# Patient Record
Sex: Female | Born: 1955
Health system: Southern US, Community
[De-identification: ages and names within clinical notes are randomized; demographics above are authoritative.]

## PROBLEM LIST (undated history)

## (undated) DIAGNOSIS — A64 Unspecified sexually transmitted disease: Secondary | ICD-10-CM

## (undated) DIAGNOSIS — N809 Endometriosis, unspecified: Secondary | ICD-10-CM

## (undated) DIAGNOSIS — IMO0002 Reserved for concepts with insufficient information to code with codable children: Secondary | ICD-10-CM

## (undated) DIAGNOSIS — B009 Herpesviral infection, unspecified: Secondary | ICD-10-CM

## (undated) DIAGNOSIS — Z87440 Personal history of urinary (tract) infections: Secondary | ICD-10-CM

## (undated) DIAGNOSIS — B977 Papillomavirus as the cause of diseases classified elsewhere: Secondary | ICD-10-CM

## (undated) DIAGNOSIS — D219 Benign neoplasm of connective and other soft tissue, unspecified: Secondary | ICD-10-CM

## (undated) DIAGNOSIS — I2510 Atherosclerotic heart disease of native coronary artery without angina pectoris: Secondary | ICD-10-CM

## (undated) DIAGNOSIS — D259 Leiomyoma of uterus, unspecified: Secondary | ICD-10-CM

## (undated) DIAGNOSIS — R87610 Atypical squamous cells of undetermined significance on cytologic smear of cervix (ASC-US): Secondary | ICD-10-CM

## (undated) DIAGNOSIS — N951 Menopausal and female climacteric states: Secondary | ICD-10-CM

## (undated) DIAGNOSIS — N924 Excessive bleeding in the premenopausal period: Secondary | ICD-10-CM

## (undated) HISTORY — DX: Reserved for concepts with insufficient information to code with codable children: IMO0002

## (undated) HISTORY — DX: Atherosclerotic heart disease of native coronary artery without angina pectoris: I25.10

## (undated) HISTORY — DX: Endometriosis, unspecified: N80.9

## (undated) HISTORY — DX: Benign neoplasm of connective and other soft tissue, unspecified: D21.9

## (undated) HISTORY — DX: Personal history of urinary (tract) infections: Z87.440

## (undated) HISTORY — PX: WISDOM TOOTH EXTRACTION: SHX21

## (undated) HISTORY — DX: Herpesviral infection, unspecified: B00.9

## (undated) HISTORY — DX: Excessive bleeding in the premenopausal period: N92.4

## (undated) HISTORY — DX: Leiomyoma of uterus, unspecified: D25.9

## (undated) HISTORY — DX: Atypical squamous cells of undetermined significance on cytologic smear of cervix (ASC-US): R87.610

## (undated) HISTORY — DX: Menopausal and female climacteric states: N95.1

## (undated) HISTORY — DX: Unspecified sexually transmitted disease: A64

## (undated) HISTORY — DX: Papillomavirus as the cause of diseases classified elsewhere: B97.7

---
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
## 1995-04-22 HISTORY — PX: MYOMECTOMY: SHX85

## 1995-04-22 HISTORY — PX: LAPAROSCOPY: SHX197

## 1997-05-22 ENCOUNTER — Encounter: Admission: RE | Admit: 1997-05-22 | Discharge: 1997-08-20 | Payer: Self-pay | Admitting: Obstetrics and Gynecology

## 1998-05-03 ENCOUNTER — Ambulatory Visit (HOSPITAL_COMMUNITY): Admission: RE | Admit: 1998-05-03 | Discharge: 1998-05-03 | Payer: Self-pay | Admitting: Obstetrics and Gynecology

## 2000-02-14 ENCOUNTER — Encounter: Payer: Self-pay | Admitting: Obstetrics and Gynecology

## 2000-02-14 ENCOUNTER — Ambulatory Visit (HOSPITAL_COMMUNITY): Admission: RE | Admit: 2000-02-14 | Discharge: 2000-02-14 | Payer: Self-pay | Admitting: Obstetrics and Gynecology

## 2000-02-19 ENCOUNTER — Encounter: Payer: Self-pay | Admitting: Obstetrics and Gynecology

## 2000-02-19 ENCOUNTER — Encounter: Admission: RE | Admit: 2000-02-19 | Discharge: 2000-02-19 | Payer: Self-pay | Admitting: Obstetrics and Gynecology

## 2001-02-04 ENCOUNTER — Other Ambulatory Visit: Admission: RE | Admit: 2001-02-04 | Discharge: 2001-02-04 | Payer: Self-pay | Admitting: Obstetrics and Gynecology

## 2001-02-19 ENCOUNTER — Encounter: Payer: Self-pay | Admitting: Obstetrics and Gynecology

## 2001-02-19 ENCOUNTER — Ambulatory Visit (HOSPITAL_COMMUNITY): Admission: RE | Admit: 2001-02-19 | Discharge: 2001-02-19 | Payer: Self-pay | Admitting: Obstetrics and Gynecology

## 2002-03-29 ENCOUNTER — Other Ambulatory Visit: Admission: RE | Admit: 2002-03-29 | Discharge: 2002-03-29 | Payer: Self-pay | Admitting: Obstetrics and Gynecology

## 2002-04-05 ENCOUNTER — Ambulatory Visit (HOSPITAL_COMMUNITY): Admission: RE | Admit: 2002-04-05 | Discharge: 2002-04-05 | Payer: Self-pay | Admitting: Obstetrics and Gynecology

## 2002-04-05 ENCOUNTER — Encounter: Payer: Self-pay | Admitting: Obstetrics and Gynecology

## 2003-04-03 ENCOUNTER — Other Ambulatory Visit: Admission: RE | Admit: 2003-04-03 | Discharge: 2003-04-03 | Payer: Self-pay | Admitting: Obstetrics and Gynecology

## 2003-05-01 ENCOUNTER — Ambulatory Visit (HOSPITAL_COMMUNITY): Admission: RE | Admit: 2003-05-01 | Discharge: 2003-05-01 | Payer: Self-pay | Admitting: Obstetrics and Gynecology

## 2004-03-20 ENCOUNTER — Other Ambulatory Visit: Admission: RE | Admit: 2004-03-20 | Discharge: 2004-03-20 | Payer: Self-pay | Admitting: Family Medicine

## 2005-06-11 ENCOUNTER — Other Ambulatory Visit: Admission: RE | Admit: 2005-06-11 | Discharge: 2005-06-11 | Payer: Self-pay | Admitting: Family Medicine

## 2005-11-05 ENCOUNTER — Ambulatory Visit (HOSPITAL_COMMUNITY): Admission: RE | Admit: 2005-11-05 | Discharge: 2005-11-05 | Payer: Self-pay | Admitting: Family Medicine

## 2006-11-09 ENCOUNTER — Ambulatory Visit (HOSPITAL_COMMUNITY): Admission: RE | Admit: 2006-11-09 | Discharge: 2006-11-09 | Payer: Self-pay | Admitting: Obstetrics and Gynecology

## 2007-10-27 DIAGNOSIS — B009 Herpesviral infection, unspecified: Secondary | ICD-10-CM

## 2007-10-27 HISTORY — DX: Herpesviral infection, unspecified: B00.9

## 2007-11-24 ENCOUNTER — Ambulatory Visit (HOSPITAL_COMMUNITY): Admission: RE | Admit: 2007-11-24 | Discharge: 2007-11-24 | Payer: Self-pay | Admitting: Obstetrics and Gynecology

## 2008-11-30 ENCOUNTER — Ambulatory Visit (HOSPITAL_COMMUNITY): Admission: RE | Admit: 2008-11-30 | Discharge: 2008-11-30 | Payer: Self-pay | Admitting: Obstetrics and Gynecology

## 2009-12-06 ENCOUNTER — Ambulatory Visit (HOSPITAL_COMMUNITY): Admission: RE | Admit: 2009-12-06 | Discharge: 2009-12-06 | Payer: Self-pay | Admitting: Obstetrics and Gynecology

## 2010-05-12 ENCOUNTER — Encounter: Payer: Self-pay | Admitting: Family Medicine

## 2011-01-10 ENCOUNTER — Other Ambulatory Visit (HOSPITAL_COMMUNITY): Payer: Self-pay | Admitting: Obstetrics and Gynecology

## 2011-01-10 DIAGNOSIS — Z1231 Encounter for screening mammogram for malignant neoplasm of breast: Secondary | ICD-10-CM

## 2011-01-22 ENCOUNTER — Ambulatory Visit (HOSPITAL_COMMUNITY)
Admission: RE | Admit: 2011-01-22 | Discharge: 2011-01-22 | Disposition: A | Discharge status: Home / Self Care | Payer: 59 | Source: Ambulatory Visit | Attending: Obstetrics and Gynecology | Admitting: Obstetrics and Gynecology

## 2011-01-22 DIAGNOSIS — Z1231 Encounter for screening mammogram for malignant neoplasm of breast: Secondary | ICD-10-CM | POA: Insufficient documentation

## 2011-06-13 ENCOUNTER — Ambulatory Visit
Admission: RE | Admit: 2011-06-13 | Discharge: 2011-06-13 | Disposition: A | Discharge status: Home / Self Care | Payer: 59 | Source: Ambulatory Visit | Attending: Family Medicine | Admitting: Family Medicine

## 2011-06-13 ENCOUNTER — Other Ambulatory Visit: Payer: Self-pay | Admitting: Family Medicine

## 2011-06-13 DIAGNOSIS — R109 Unspecified abdominal pain: Secondary | ICD-10-CM

## 2011-07-01 ENCOUNTER — Ambulatory Visit (INDEPENDENT_AMBULATORY_CARE_PROVIDER_SITE_OTHER): Payer: 59 | Admitting: General Surgery

## 2011-07-14 ENCOUNTER — Encounter (INDEPENDENT_AMBULATORY_CARE_PROVIDER_SITE_OTHER): Payer: Self-pay | Admitting: General Surgery

## 2011-07-14 ENCOUNTER — Ambulatory Visit (INDEPENDENT_AMBULATORY_CARE_PROVIDER_SITE_OTHER): Payer: 59 | Admitting: General Surgery

## 2011-07-14 VITALS — BP 114/76 | HR 76 | Temp 98.0°F | Resp 16 | Ht 66.0 in | Wt 132.8 lb

## 2011-07-14 DIAGNOSIS — K8020 Calculus of gallbladder without cholecystitis without obstruction: Secondary | ICD-10-CM

## 2011-07-14 NOTE — Patient Instructions (Signed)
[facility] LAPAROSCOPIC SURGERY: POST OP INSTRUCTIONS  Always review your discharge instruction sheet given to you by the facility where your surgery was performed. IF YOU HAVE DISABILITY OR FAMILY LEAVE FORMS, YOU MUST BRING THEM TO THE OFFICE FOR PROCESSING.   DO NOT GIVE THEM TO YOUR DOCTOR.  1. A prescription for pain medication may be given to you upon discharge.  Take your pain medication as prescribed, if needed.  If narcotic pain medicine is not needed, then you may take acetaminophen (Tylenol), naprosyn (Alleve), or ibuprofen (Advil) as needed. 2. Take your usually prescribed medications unless otherwise directed. 3. If you need a refill on your pain medication, please contact your pharmacy.  They will contact our office to request authorization. Prescriptions will not be filled after 5pm or on week-ends. 4. You should follow a light diet the first few days after arrival home, such as soup and crackers, etc.  Be sure to include lots of fluids daily. 5. Most patients will experience some swelling and bruising in the area of the incisions.  Ice packs will help.  Swelling and bruising can take several days to resolve.  6. It is common to experience some constipation if taking pain medication after surgery.  Increasing fluid intake and taking a stool softener (such as Colace) will usually help or prevent this problem from occurring.  A mild laxative (Milk of Magnesia or Miralax) should be taken according to package instructions if there are no bowel movements after 48 hours. 7. Unless discharge instructions indicate otherwise, you may remove your bandages 48 hours after surgery, and you may shower at that time.  You may have steri-strips (small skin tapes) in place directly over the incision.  These strips should be left on the skin for 7-10 days.  If your surgeon used skin glue on the incision, you may shower in 24 hours.  The glue will flake  off over the next 2-3 weeks.  Any sutures or staples will be removed at the office during your follow-up visit. 8. ACTIVITIES:  You may resume regular (light) daily activities beginning the next day--such as daily self-care, walking, climbing stairs--gradually increasing activities as tolerated.  You may have sexual intercourse when it is comfortable.  Refrain from any heavy lifting or straining until approved by your doctor. a. You may drive when you are no longer taking prescription pain medication, you can comfortably wear a seatbelt, and you can safely maneuver your car and apply brakes. b. RETURN TO WORK:  __________________________________________________________ 9. You should see your doctor in the office for a follow-up appointment approximately 2-3 weeks after your surgery.  Make sure that you call for this appointment within a day or two after you arrive home to insure a convenient appointment time. 10. OTHER INSTRUCTIONS: __________________________________________________________________________________________________________________________ __________________________________________________________________________________________________________________________ WHEN TO CALL YOUR DOCTOR: 1. Fever over 101.0 2. Inability to urinate 3. Continued bleeding from incision. 4. Increased pain, redness, or drainage from the incision. 5. Increasing abdominal pain  The clinic staff is available to answer your questions during regular business hours.  Please don't hesitate to call and ask to speak to one of the nurses for clinical concerns.  If you have a medical emergency, go to the nearest emergency room or call 911.  A surgeon from [facility] is always on call at the hospital. [address], [address], [place], [place]  [address] ? [address] [address], [place], [place]   [address] [phone] ? [phone] ? [phone] [phone]   [phone] Web site: [email or web address]  

## 2011-07-14 NOTE — Progress Notes (Signed)
Patient ID: Meghan Peterson, female   DOB: [birth year], 56 y.o.   MRN: [id number]  Chief Complaint  Patient presents with  . Abdominal Pain    new pt- eval GB    HPI Meghan Peterson is a 56 y.o. female.  Referred by Dr. Elias Else HPI This is a 56 year old female who is otherwise healthy who presents after having about 4 episodes this year of epigastric and right upper quadrant discomfort associated with nausea and emesis. She noted that when she has been eating at night she will lie down and then began to have some significant nausea as well as multiple episodes of emesis. This is associated with some epigastric and right upper quadrant discomfort also. This has happened 4 times since January. Her last episode was in February. She has changed her diet since then and is feels much better. She underwent a ultrasound which showed a fairly large gallstone and then was referred for possible surgery. She has no fevers associated with this. Eating certain foods does make it worse. She has gained some relief by changing her diet.  History reviewed. No pertinent past medical history.  Past Surgical History  Procedure Date  . Cesarean section 12/17/96  . Laparoscopy 1997  . Breast cyst excision 1978    left breast    Family History  Problem Relation Age of Onset  . Kidney disease Father   . Cancer Father     colon  . Cancer Maternal Aunt     breast    Social History History  Substance Use Topics  . Smoking status: Former Smoker    Quit date: 04/22/1987  . Smokeless tobacco: Not on file  . Alcohol Use: Yes    No Known Allergies  Current Outpatient Prescriptions  Medication Sig Dispense Refill  . etonogestrel-ethinyl estradiol (NUVARING) 0.12-0.015 MG/24HR vaginal ring Place 1 each vaginally every 28 (twenty-eight) days. Insert vaginally and leave in place for 3 consecutive weeks, then remove for 1 week.        Review of Systems Review of Systems  Constitutional: Negative for  fever, chills and unexpected weight change.  HENT: Negative for hearing loss, congestion, sore throat, trouble swallowing and voice change.   Eyes: Negative for visual disturbance.  Respiratory: Negative for cough and wheezing.   Cardiovascular: Negative for chest pain, palpitations and leg swelling.  Gastrointestinal: Positive for nausea and abdominal distention. Negative for vomiting, abdominal pain, diarrhea, constipation, blood in stool and anal bleeding.  Genitourinary: Negative for hematuria, vaginal bleeding and difficulty urinating.  Musculoskeletal: Negative for arthralgias.  Skin: Negative for rash and wound.  Neurological: Negative for seizures, syncope and headaches.  Hematological: Negative for adenopathy. Does not bruise/bleed easily.  Psychiatric/Behavioral: Negative for confusion.    Blood pressure 114/76, pulse 76, temperature 98 F (36.7 C), temperature source Temporal, resp. rate 16, height 5\' 6"  (1.676 m), weight 132 lb 12.8 oz (60.238 kg).  Physical Exam Physical Exam  Vitals reviewed. Constitutional: She appears well-developed and well-nourished.  Eyes: No scleral icterus.  Neck: Neck supple.  Cardiovascular: Normal rate, regular rhythm and normal heart sounds.   Pulmonary/Chest: Effort normal and breath sounds normal. She has no wheezes. She has no rales.  Abdominal: Soft. Bowel sounds are normal. There is no hepatomegaly. There is no tenderness.  Lymphadenopathy:    She has no cervical adenopathy.    Data Reviewed COMPLETE ABDOMINAL ULTRASOUND  Comparison: None.  Findings:  Gallbladder: 3.9 cm gallstone. Additional gallbladder  sludge/small stones. No pericholecystic  fluid, gallbladder wall  thickening, or sonographic Murphy's sign.  Common bile duct: Measures 5 mm.  Liver: No focal lesion identified. Within normal limits in  parenchymal echogenicity.  IVC: Appears normal.  Pancreas: Incompletely visualized but grossly unremarkable.  Spleen: Poorly  visualized. Measures approximately 4.6 cm.  Right Kidney: Measures 10.8 cm. No mass or hydronephrosis.  Left Kidney: Measures 10.0 cm. No mass or hydronephrosis  Abdominal aorta: No aneurysm identified.  IMPRESSION:  Cholelithiasis, without associated inflammatory changes to suggest  acute cholecystitis.   Assessment    Symptomatic cholelithiasis    Plan   I do think her symptoms are referable to her gallbladder I discussed the procedure in detail.  The patient was given Agricultural engineer.  We discussed the risks and benefits of a laparoscopic cholecystectomy and possible cholangiogram including, but not limited to bleeding, infection, injury to surrounding structures such as the intestine or liver, bile leak, retained gallstones, need to convert to an open procedure, prolonged diarrhea, blood clots such as  DVT, common bile duct injury, anesthesia risks, and possible need for additional procedures.  The likelihood of improvement in symptoms and return to the patient's normal status is good. We discussed the typical post-operative recovery course.        Onofrio Klemp 07/14/2011, 12:16 PM

## 2011-07-31 ENCOUNTER — Telehealth (INDEPENDENT_AMBULATORY_CARE_PROVIDER_SITE_OTHER): Payer: Self-pay | Admitting: General Surgery

## 2011-07-31 ENCOUNTER — Encounter (INDEPENDENT_AMBULATORY_CARE_PROVIDER_SITE_OTHER): Payer: Self-pay

## 2011-07-31 NOTE — Telephone Encounter (Signed)
LMOM for pt stating that I mailed her RTW note to her home.

## 2011-08-01 ENCOUNTER — Telehealth (INDEPENDENT_AMBULATORY_CARE_PROVIDER_SITE_OTHER): Payer: Self-pay

## 2011-08-01 ENCOUNTER — Encounter (INDEPENDENT_AMBULATORY_CARE_PROVIDER_SITE_OTHER): Payer: Self-pay

## 2011-08-01 NOTE — Telephone Encounter (Signed)
LMOM stating that Dr Dwain Sarna did agree that the pt will be out of work for 4wks. I did get a new letter written for pt's work and mailed to pt's home.

## 2011-08-07 ENCOUNTER — Other Ambulatory Visit (INDEPENDENT_AMBULATORY_CARE_PROVIDER_SITE_OTHER): Payer: Self-pay | Admitting: General Surgery

## 2011-08-07 ENCOUNTER — Telehealth (INDEPENDENT_AMBULATORY_CARE_PROVIDER_SITE_OTHER): Payer: Self-pay

## 2011-08-07 NOTE — Telephone Encounter (Signed)
[facility] pre admit needs surgery orders put in on Meghan Peterson [birth year]. Surgery is Tuesday.

## 2011-08-11 ENCOUNTER — Ambulatory Visit: Payer: Self-pay | Admitting: Obstetrics and Gynecology

## 2011-08-11 ENCOUNTER — Encounter (HOSPITAL_COMMUNITY): Payer: Self-pay | Admitting: Pharmacy Technician

## 2011-08-12 ENCOUNTER — Encounter (HOSPITAL_COMMUNITY): Payer: Self-pay

## 2011-08-12 ENCOUNTER — Encounter (HOSPITAL_COMMUNITY)
Admission: RE | Admit: 2011-08-12 | Discharge: 2011-08-12 | Disposition: A | Discharge status: Home / Self Care | Payer: 59 | Source: Ambulatory Visit | Attending: General Surgery | Admitting: General Surgery

## 2011-08-12 LAB — COMPREHENSIVE METABOLIC PANEL
ALT: 10 U/L (ref 0–35)
AST: 12 U/L (ref 0–37)
Albumin: 3.5 g/dL (ref 3.5–5.2)
CO2: 28 mEq/L (ref 19–32)
Calcium: 9.8 mg/dL (ref 8.4–10.5)
Chloride: 106 mEq/L (ref 96–112)
GFR calc non Af Amer: 82 mL/min — ABNORMAL LOW (ref >90)
Sodium: 141 mEq/L (ref 135–145)
Total Bilirubin: 0.3 mg/dL (ref 0.3–1.2)

## 2011-08-12 LAB — SURGICAL PCR SCREEN: MRSA, PCR: NEGATIVE

## 2011-08-12 LAB — CBC
Platelets: 370 10*3/uL (ref 150–400)
RBC: 4.64 MIL/uL (ref 3.87–5.11)
RDW: 14 % (ref 11.5–15.5)
WBC: 5.2 10*3/uL (ref 4.0–10.5)

## 2011-08-12 NOTE — Pre-Procedure Instructions (Signed)
CBC, CMET, SERUM PREGNANCY AND EKG WERE DONE TODAY AT [facility] PREOP.

## 2011-08-12 NOTE — Patient Instructions (Signed)
YOUR SURGERY IS SCHEDULED ON:  WED MAY 1 -- AT 8:30 AM  REPORT TO [facility] AT: 6:30 AM      PHONE # FOR SHORT STAY IS [phone]  DO NOT EAT OR DRINK ANYTHING AFTER MIDNIGHT THE NIGHT BEFORE YOUR SURGERY.  YOU MAY BRUSH YOUR TEETH, RINSE OUT YOUR MOUTH--BUT NO WATER, NO FOOD, NO CHEWING GUM, NO MINTS, NO CANDIES, NO CHEWING TOBACCO.  PLEASE TAKE THE FOLLOWING MEDICATIONS THE AM OF YOUR SURGERY WITH A FEW SIPS OF WATER:  NO MEDS TO TAKE    IF YOU USE INHALERS--USE YOUR INHALERS THE AM OF YOUR SURGERY AND BRING INHALERS TO THE HOSPITAL -TAKE TO SURGERY.    IF YOU ARE DIABETIC:  DO NOT TAKE ANY DIABETIC MEDICATIONS THE AM OF YOUR SURGERY.  IF YOU TAKE INSULIN IN THE EVENINGS--PLEASE ONLY TAKE 1/2 NORMAL EVENING DOSE THE NIGHT BEFORE YOUR SURGERY.  NO INSULIN THE AM OF YOUR SURGERY.  IF YOU HAVE SLEEP APNEA AND USE CPAP OR BIPAP--PLEASE BRING THE MASK --NOT THE MACHINE-NOT THE TUBING   -JUST THE MASK. DO NOT BRING VALUABLES, MONEY, CREDIT CARDS.  CONTACT LENS, DENTURES / PARTIALS, GLASSES SHOULD NOT BE WORN TO SURGERY AND IN MOST CASES-HEARING AIDS WILL NEED TO BE REMOVED.  BRING YOUR GLASSES CASE, ANY EQUIPMENT NEEDED FOR YOUR CONTACT LENS. FOR PATIENTS ADMITTED TO THE HOSPITAL--CHECK OUT TIME THE DAY OF DISCHARGE IS 11:00 AM.  ALL INPATIENT ROOMS ARE PRIVATE - WITH BATHROOM, TELEPHONE, TELEVISION AND WIFI INTERNET. IF YOU ARE BEING DISCHARGED THE SAME DAY OF YOUR SURGERY--YOU CAN NOT DRIVE YOURSELF HOME--AND SHOULD NOT GO HOME ALONE BY TAXI OR BUS.  NO DRIVING OR OPERATING MACHINERY FOR 24 HOURS FOLLOWING ANESTHESIA / PAIN MEDICATIONS.                            SPECIAL INSTRUCTIONS:  CHLORHEXIDINE SOAP SHOWER (other brand names are Betasept and Hibiclens ) PLEASE SHOWER WITH CHLORHEXIDINE THE NIGHT BEFORE YOUR SURGERY AND THE AM OF YOUR SURGERY. DO NOT USE CHLORHEXIDINE ON YOUR FACE OR PRIVATE AREAS--YOU MAY USE YOUR NORMAL SOAP THOSE AREAS AND YOUR NORMAL SHAMPOO.    WOMEN SHOULD AVOID SHAVING UNDER ARMS AND SHAVING LEGS 48 HOURS BEFORE USING CHLORHEXIDINE TO AVOID SKIN IRRITATION.  DO NOT USE IF ALLERGIC TO CHLORHEXIDINE.  PLEASE READ OVER ANY  FACT SHEETS THAT YOU WERE GIVEN: MRSA INFORMATION

## 2011-08-18 ENCOUNTER — Other Ambulatory Visit: Payer: Self-pay | Admitting: Obstetrics and Gynecology

## 2011-08-19 ENCOUNTER — Other Ambulatory Visit: Payer: Self-pay

## 2011-08-19 MED ORDER — ETONOGESTREL-ETHINYL ESTRADIOL 0.12-0.015 MG/24HR VA RING: VAGINAL_RING | VAGINAL | Status: DC

## 2011-08-19 NOTE — Telephone Encounter (Signed)
Rx sent to pharmacy for Nuvaring per rx request. Pt has AEX sched 09-17-11@10 :45a with [facility]. Pt aware.

## 2011-08-20 ENCOUNTER — Encounter (HOSPITAL_COMMUNITY): Payer: Self-pay | Admitting: *Deleted

## 2011-08-20 ENCOUNTER — Encounter (HOSPITAL_COMMUNITY)
Admission: RE | Disposition: A | Discharge status: Home / Self Care | Payer: Self-pay | Source: Ambulatory Visit | Attending: General Surgery

## 2011-08-20 ENCOUNTER — Ambulatory Visit (HOSPITAL_COMMUNITY)
Admission: RE | Admit: 2011-08-20 | Discharge: 2011-08-20 | Disposition: A | Discharge status: Home / Self Care | Payer: 59 | Source: Ambulatory Visit | Attending: General Surgery | Admitting: General Surgery

## 2011-08-20 ENCOUNTER — Encounter (HOSPITAL_COMMUNITY): Payer: Self-pay | Admitting: Anesthesiology

## 2011-08-20 ENCOUNTER — Ambulatory Visit (HOSPITAL_COMMUNITY): Payer: 59

## 2011-08-20 ENCOUNTER — Ambulatory Visit (HOSPITAL_COMMUNITY): Payer: 59 | Admitting: Anesthesiology

## 2011-08-20 DIAGNOSIS — Z01812 Encounter for preprocedural laboratory examination: Secondary | ICD-10-CM | POA: Insufficient documentation

## 2011-08-20 DIAGNOSIS — K8010 Calculus of gallbladder with chronic cholecystitis without obstruction: Secondary | ICD-10-CM | POA: Insufficient documentation

## 2011-08-20 DIAGNOSIS — Z01810 Encounter for preprocedural cardiovascular examination: Secondary | ICD-10-CM | POA: Insufficient documentation

## 2011-08-20 HISTORY — PX: CHOLECYSTECTOMY: SHX55

## 2011-08-20 SURGERY — LAPAROSCOPIC CHOLECYSTECTOMY WITH INTRAOPERATIVE CHOLANGIOGRAM
Anesthesia: General | Site: Abdomen | Wound class: Contaminated

## 2011-08-20 MED ORDER — FENTANYL CITRATE 0.05 MG/ML IJ SOLN
INTRAMUSCULAR | Status: DC | PRN
  Administered 2011-08-20 (×5): 50 ug via INTRAVENOUS

## 2011-08-20 MED ORDER — NEOSTIGMINE METHYLSULFATE 1 MG/ML IJ SOLN
INTRAMUSCULAR | Status: DC | PRN
  Administered 2011-08-20: 3 mg via INTRAVENOUS

## 2011-08-20 MED ORDER — LACTATED RINGERS IV SOLN
INTRAVENOUS | Status: DC | PRN
  Administered 2011-08-20 (×2): via INTRAVENOUS

## 2011-08-20 MED ORDER — IOHEXOL 300 MG/ML  SOLN
INTRAMUSCULAR | Status: AC
  Filled 2011-08-20: qty 1

## 2011-08-20 MED ORDER — MIDAZOLAM HCL 5 MG/5ML IJ SOLN
INTRAMUSCULAR | Status: DC | PRN
  Administered 2011-08-20: 2 mg via INTRAVENOUS

## 2011-08-20 MED ORDER — ONDANSETRON HCL 4 MG/2ML IJ SOLN
INTRAMUSCULAR | Status: DC | PRN
  Administered 2011-08-20: 4 mg via INTRAVENOUS

## 2011-08-20 MED ORDER — GLYCOPYRROLATE 0.2 MG/ML IJ SOLN
INTRAMUSCULAR | Status: DC | PRN
  Administered 2011-08-20: 0.4 mg via INTRAVENOUS

## 2011-08-20 MED ORDER — BUPIVACAINE HCL (PF) 0.25 % IJ SOLN
INTRAMUSCULAR | Status: DC | PRN
  Administered 2011-08-20: 16 mL

## 2011-08-20 MED ORDER — SODIUM CHLORIDE 0.9 % IJ SOLN: 3.0000 mL | INTRAMUSCULAR | Status: DC | PRN

## 2011-08-20 MED ORDER — IOHEXOL 300 MG/ML  SOLN
INTRAMUSCULAR | Status: DC | PRN
  Administered 2011-08-20: 4 mL via INTRAVENOUS

## 2011-08-20 MED ORDER — PROPOFOL 10 MG/ML IV BOLUS
INTRAVENOUS | Status: DC | PRN
  Administered 2011-08-20: 150 mg via INTRAVENOUS

## 2011-08-20 MED ORDER — OXYCODONE HCL 5 MG PO TABS
ORAL_TABLET | ORAL | Status: AC
  Filled 2011-08-20: qty 1

## 2011-08-20 MED ORDER — ONDANSETRON HCL 4 MG/2ML IJ SOLN: 4.0000 mg | Freq: Four times a day (QID) | INTRAMUSCULAR | Status: DC | PRN

## 2011-08-20 MED ORDER — ACETAMINOPHEN 10 MG/ML IV SOLN
INTRAVENOUS | Status: AC
  Filled 2011-08-20: qty 100

## 2011-08-20 MED ORDER — SODIUM CHLORIDE 0.9 % IJ SOLN: 3.0000 mL | Freq: Two times a day (BID) | INTRAMUSCULAR | Status: DC

## 2011-08-20 MED ORDER — DEXAMETHASONE SODIUM PHOSPHATE 10 MG/ML IJ SOLN
INTRAMUSCULAR | Status: DC | PRN
  Administered 2011-08-20: 10 mg via INTRAVENOUS

## 2011-08-20 MED ORDER — CEFOXITIN SODIUM-DEXTROSE 1-4 GM-% IV SOLR (PREMIX)
INTRAVENOUS | Status: AC
  Filled 2011-08-20: qty 50

## 2011-08-20 MED ORDER — ACETAMINOPHEN 650 MG RE SUPP
650.0000 mg | RECTAL | Status: DC | PRN
  Filled 2011-08-20: qty 1

## 2011-08-20 MED ORDER — OXYCODONE HCL 5 MG PO TABS
5.0000 mg | ORAL_TABLET | ORAL | Status: DC | PRN
Start: 2011-08-20 — End: 2011-08-20
  Administered 2011-08-20: 5 mg via ORAL

## 2011-08-20 MED ORDER — FENTANYL CITRATE 0.05 MG/ML IJ SOLN: 25.0000 ug | INTRAMUSCULAR | Status: DC | PRN

## 2011-08-20 MED ORDER — OXYCODONE-ACETAMINOPHEN 5-325 MG PO TABS: 1.0000 | ORAL_TABLET | ORAL | Status: AC | PRN

## 2011-08-20 MED ORDER — DEXTROSE 5 % IV SOLN
1.0000 g | INTRAVENOUS | Status: AC
  Administered 2011-08-20: 1 g via INTRAVENOUS

## 2011-08-20 MED ORDER — PROMETHAZINE HCL 25 MG/ML IJ SOLN: 6.2500 mg | INTRAMUSCULAR | Status: DC | PRN

## 2011-08-20 MED ORDER — MORPHINE SULFATE 10 MG/ML IJ SOLN: 2.0000 mg | INTRAMUSCULAR | Status: DC | PRN

## 2011-08-20 MED ORDER — 0.9 % SODIUM CHLORIDE (POUR BTL) OPTIME
TOPICAL | Status: DC | PRN
  Administered 2011-08-20: 1000 mL

## 2011-08-20 MED ORDER — LACTATED RINGERS IV SOLN: INTRAVENOUS | Status: DC

## 2011-08-20 MED ORDER — FENTANYL CITRATE 0.05 MG/ML IJ SOLN
INTRAMUSCULAR | Status: AC
  Filled 2011-08-20: qty 2

## 2011-08-20 MED ORDER — LACTATED RINGERS IR SOLN
Status: DC | PRN
  Administered 2011-08-20: 1000 mL

## 2011-08-20 MED ORDER — SODIUM CHLORIDE 0.9 % IV SOLN: 250.0000 mL | INTRAVENOUS | Status: DC | PRN

## 2011-08-20 MED ORDER — BUPIVACAINE HCL (PF) 0.25 % IJ SOLN
INTRAMUSCULAR | Status: AC
  Filled 2011-08-20: qty 30

## 2011-08-20 MED ORDER — SUCCINYLCHOLINE CHLORIDE 20 MG/ML IJ SOLN
INTRAMUSCULAR | Status: DC | PRN
  Administered 2011-08-20: 100 mg via INTRAVENOUS

## 2011-08-20 MED ORDER — ACETAMINOPHEN 325 MG PO TABS: 650.0000 mg | ORAL_TABLET | ORAL | Status: DC | PRN

## 2011-08-20 MED ORDER — LIDOCAINE HCL (CARDIAC) 20 MG/ML IV SOLN
INTRAVENOUS | Status: DC | PRN
  Administered 2011-08-20: 50 mg via INTRAVENOUS

## 2011-08-20 MED ORDER — ACETAMINOPHEN 10 MG/ML IV SOLN
INTRAVENOUS | Status: DC | PRN
  Administered 2011-08-20: 1000 mg via INTRAVENOUS

## 2011-08-20 MED ORDER — ROCURONIUM BROMIDE 100 MG/10ML IV SOLN
INTRAVENOUS | Status: DC | PRN
  Administered 2011-08-20: 30 mg via INTRAVENOUS

## 2011-08-20 SURGICAL SUPPLY — 49 items
ADH SKN CLS APL DERMABOND .7 (GAUZE/BANDAGES/DRESSINGS)
APL SKNCLS STERI-STRIP NONHPOA (GAUZE/BANDAGES/DRESSINGS) ×2
APPLIER CLIP 5 13 M/L LIGAMAX5 (MISCELLANEOUS) ×2
APPLIER CLIP ROT 10 11.4 M/L (STAPLE)
APR CLP MED LRG 11.4X10 (STAPLE)
APR CLP MED LRG 5 ANG JAW (MISCELLANEOUS) ×1
BAG SPEC RTRVL LRG 6X4 10 (ENDOMECHANICALS) ×1
BENZOIN TINCTURE PRP APPL 2/3 (GAUZE/BANDAGES/DRESSINGS) ×2 IMPLANT
CANISTER SUCTION 2500CC (MISCELLANEOUS) ×2 IMPLANT
CATH FOLEY 2WAY SLVR  5CC 14FR (CATHETERS) ×1
CATH FOLEY 2WAY SLVR 5CC 14FR (CATHETERS) IMPLANT
CLIP APPLIE 5 13 M/L LIGAMAX5 (MISCELLANEOUS) ×1 IMPLANT
CLIP APPLIE ROT 10 11.4 M/L (STAPLE) IMPLANT
CLOTH BEACON ORANGE TIMEOUT ST (SAFETY) ×2 IMPLANT
COVER MAYO STAND STRL (DRAPES) ×1 IMPLANT
DECANTER SPIKE VIAL GLASS SM (MISCELLANEOUS) ×2 IMPLANT
DERMABOND ADVANCED (GAUZE/BANDAGES/DRESSINGS)
DERMABOND ADVANCED .7 DNX12 (GAUZE/BANDAGES/DRESSINGS) IMPLANT
DRAPE C-ARM 42X72 X-RAY (DRAPES) ×1 IMPLANT
DRAPE LAPAROSCOPIC ABDOMINAL (DRAPES) ×2 IMPLANT
DRSG TEGADERM 4X4.75 (GAUZE/BANDAGES/DRESSINGS) ×2 IMPLANT
ELECT CAUTERY BLADE 6.4 (BLADE) ×1 IMPLANT
ELECT REM PT RETURN 9FT ADLT (ELECTROSURGICAL) ×2
ELECTRODE REM PT RTRN 9FT ADLT (ELECTROSURGICAL) ×1 IMPLANT
GLOVE BIO SURGEON STRL SZ7 (GLOVE) ×2 IMPLANT
GLOVE BIOGEL PI IND STRL 7.5 (GLOVE) ×1 IMPLANT
GLOVE BIOGEL PI INDICATOR 7.5 (GLOVE) ×1
GOWN PREVENTION PLUS LG XLONG (DISPOSABLE) ×2 IMPLANT
GOWN PREVENTION PLUS XLARGE (GOWN DISPOSABLE) ×2 IMPLANT
GOWN STRL NON-REIN LRG LVL3 (GOWN DISPOSABLE) ×2 IMPLANT
GOWN STRL REIN XL XLG (GOWN DISPOSABLE) ×2 IMPLANT
HEMOSTAT SNOW SURGICEL 2X4 (HEMOSTASIS) ×1 IMPLANT
KIT BASIN OR (CUSTOM PROCEDURE TRAY) ×2 IMPLANT
NS IRRIG 1000ML POUR BTL (IV SOLUTION) ×2 IMPLANT
PENCIL BUTTON HOLSTER BLD 10FT (ELECTRODE) ×1 IMPLANT
POUCH SPECIMEN RETRIEVAL 10MM (ENDOMECHANICALS) ×2 IMPLANT
SET CHOLANGIOGRAPH MIX (MISCELLANEOUS) ×1 IMPLANT
SET IRRIG TUBING LAPAROSCOPIC (IRRIGATION / IRRIGATOR) ×2 IMPLANT
SOLUTION ANTI FOG 6CC (MISCELLANEOUS) ×2 IMPLANT
SPONGE GAUZE 4X4 12PLY (GAUZE/BANDAGES/DRESSINGS) ×1 IMPLANT
STRIP CLOSURE SKIN 1/2X4 (GAUZE/BANDAGES/DRESSINGS) ×2 IMPLANT
SUT MNCRL AB 4-0 PS2 18 (SUTURE) ×2 IMPLANT
SUT VICRYL 0 UR6 27IN ABS (SUTURE) ×2 IMPLANT
TOWEL OR 17X26 10 PK STRL BLUE (TOWEL DISPOSABLE) ×2 IMPLANT
TRAY LAP CHOLE (CUSTOM PROCEDURE TRAY) ×2 IMPLANT
TROCAR BLADELESS OPT 5 75 (ENDOMECHANICALS) ×6 IMPLANT
TROCAR XCEL BLUNT TIP 100MML (ENDOMECHANICALS) ×2 IMPLANT
TROCAR XCEL NON-BLD 11X100MML (ENDOMECHANICALS) IMPLANT
TUBING INSUFFLATION 10FT LAP (TUBING) ×2 IMPLANT

## 2011-08-20 NOTE — Discharge Instructions (Signed)
[facility] LAPAROSCOPIC SURGERY: POST OP INSTRUCTIONS  Always review your discharge instruction sheet given to you by the facility where your surgery was performed. IF YOU HAVE DISABILITY OR FAMILY LEAVE FORMS, YOU MUST BRING THEM TO THE OFFICE FOR PROCESSING.   DO NOT GIVE THEM TO YOUR DOCTOR.  1. A prescription for pain medication may be given to you upon discharge.  Take your pain medication as prescribed, if needed.  If narcotic pain medicine is not needed, then you may take acetaminophen (Tylenol), naprosyn (Alleve), or ibuprofen (Advil) as needed. 2. Take your usually prescribed medications unless otherwise directed. 3. If you need a refill on your pain medication, please contact your pharmacy.  They will contact our office to request authorization. Prescriptions will not be filled after 5pm or on week-ends. 4. You should follow a light diet the first few days after arrival home, such as soup and crackers, etc.  Be sure to include lots of fluids daily. 5. Most patients will experience some swelling and bruising in the area of the incisions.  Ice packs will help.  Swelling and bruising can take several days to resolve.  6. It is common to experience some constipation if taking pain medication after surgery.  Increasing fluid intake and taking a stool softener (such as Colace) will usually help or prevent this problem from occurring.  A mild laxative (Milk of Magnesia or Miralax) should be taken according to package instructions if there are no bowel movements after 48 hours. 7. Unless discharge instructions indicate otherwise, you may remove your bandages 48 hours after surgery, and you may shower at that time.  You may have steri-strips (small skin tapes) in place directly over the incision.  These strips should be left on the skin for 7-10 days.  If your surgeon used skin glue on the incision, you may shower in 24 hours.  The glue will flake  off over the next 2-3 weeks.  Any sutures or staples will be removed at the office during your follow-up visit. 8. ACTIVITIES:  You may resume regular (light) daily activities beginning the next day--such as daily self-care, walking, climbing stairs--gradually increasing activities as tolerated.  You may have sexual intercourse when it is comfortable.  Refrain from any heavy lifting or straining until approved by your doctor. a. You may drive when you are no longer taking prescription pain medication, you can comfortably wear a seatbelt, and you can safely maneuver your car and apply brakes. b. RETURN TO WORK:  __________________________________________________________ 9. You should see your doctor in the office for a follow-up appointment approximately 2-3 weeks after your surgery.  Make sure that you call for this appointment within a day or two after you arrive home to insure a convenient appointment time. 10. OTHER INSTRUCTIONS: __________________________________________________________________________________________________________________________ __________________________________________________________________________________________________________________________ WHEN TO CALL YOUR DOCTOR: 1. Fever over 101.0 2. Inability to urinate 3. Continued bleeding from incision. 4. Increased pain, redness, or drainage from the incision. 5. Increasing abdominal pain  The clinic staff is available to answer your questions during regular business hours.  Please don't hesitate to call and ask to speak to one of the nurses for clinical concerns.  If you have a medical emergency, go to the nearest emergency room or call 911.  A surgeon from [facility] is always on call at the hospital. [address], [address], [place], [place]  [address] ? [address] [address], [place], [place]   [address] [phone] ? [phone] ? [phone] [phone]   [phone] Web site: [email or web address]  

## 2011-08-20 NOTE — Op Note (Signed)
Preoperative diagnosis: Symptomatic cholelithiasis Postoperative diagnosis: Same as above plus chronic cholecystitis Procedure: Laparoscopic cholecystectomy with cholangiogram Surgeon: Dr. Harden Mo Anesthesia: Gen. Endotracheal Specimens: Gallbladder and contents to pathology Estimated blood loss: Minimal Drains: None Consultations: None Disposition to recovery in stable condition Sponge and needle count correct x2 and of operation  Indications: This is a 56 year old female with stones on ultrasound and what sounded to be biliary symptoms. We discussed a laparoscopic cholecystectomy and the risks and benefits associated with that.  Procedure: After informed consent was obtained the patient was taken to the operating room. She was administered 1 g of intravenous cefoxitin.sequential compression devices were placed on her legs prior to beginning. She was then placed under general endotracheal anesthesia without complication. Her abdomen was then prepped and draped in the standard sterile surgical fashion. A surgical timeout was performed.  I first infiltrated quarter percent Marcaine below her umbilicus. I then made a vertical incision. I identified her fascia and incised this sharply. I then pulled up her peritoneum and entered this with Metzenbaum scissors. I then placed a 0 Vicryl pursestring suture through the fascia. A Hassan trocar was introduced and the abdomen was then insufflated to 15 mmHg pressure. Three further 5 mm trocars were placed in the epigastrium and right upper quadrant under direct vision after infiltration with local anesthetic without complication. Her gallbladder was adherent to the omentum. This was taken down with a combination of blunt and sharp dissection. I then retracted her gallbladder cephalad and lateral. She basically had large stone cast in her gallbladder which made this difficult. She also had very strong evidence of chronic cholecystitis with a lot of  inflammation. I did dissect her triangle and obtain the critical view of safety. I then clipped the duct distally. I introduced a Cook catheter into a ductotomy. I then did a cholangiogram that showed I was in the cystic duct, and no stones, filling of the duodenum, and filling of both sides of the liver. I then removed the [facility] catheter, clipped the duct, and divided this. I then treated ordering a similar fashion. The gallbladder was then removed from the liver bed with some difficulty. There was really no plane between the 2 and it was very difficult to do this. I made a small entrance of the gallbladder and spilled a little bit of bile but no stones were spilled. Eventually I was able to remove this from the liver bed and placed in an Endo Catch bag. I ended up having to enlarge my umbilical incision by a couple of centimeters just to remove the gallbladder with the stone cast that was in. I then passed this off the table as a specimen. I then obtained hemostasis. I did place a piece of Surgicel on the liver bed. Irrigation was performed until this was clear. I then removed my Hassan trocar. Under vision with the laparoscope I then used 3 additional figure-of-eight sutures of 0 Vicryl to close this defect. This defect was then completely obliterated. I then removed all trocars and desufflated the abdomen. I closed these with 4-0 Monocryl. Steri-Strips and sterile dressings were placed on all these. She tolerated this well was extubated and transferred to recovery in stable condition.

## 2011-08-20 NOTE — Anesthesia Preprocedure Evaluation (Addendum)
Anesthesia Evaluation  Patient identified by MRN, date of birth, ID band Patient awake    Reviewed: Allergy & Precautions, H&P , NPO status , Patient's Chart, lab work & pertinent test results  History of Anesthesia Complications Negative for: history of anesthetic complications  Airway Mallampati: II TM Distance: >3 FB Neck ROM: Full    Dental No notable dental hx. (+) Teeth Intact and Dental Advisory Given   Pulmonary neg pulmonary ROS,  breath sounds clear to auscultation  Pulmonary exam normal       Cardiovascular negative cardio ROS  Rhythm:Regular Rate:Normal     Neuro/Psych negative neurological ROS  negative psych ROS   GI/Hepatic negative GI ROS, Neg liver ROS,   Endo/Other  negative endocrine ROS  Renal/GU negative Renal ROS  negative genitourinary   Musculoskeletal negative musculoskeletal ROS (+)   Abdominal   Peds  Hematology negative hematology ROS (+)   Anesthesia Other Findings   Reproductive/Obstetrics negative OB ROS                          Anesthesia Physical Anesthesia Plan  ASA: I  Anesthesia Plan: General   Post-op Pain Management:    Induction: Intravenous  Airway Management Planned: Oral ETT  Additional Equipment:   Intra-op Plan:   Post-operative Plan: Extubation in OR  Informed Consent: I have reviewed the patients History and Physical, chart, labs and discussed the procedure including the risks, benefits and alternatives for the proposed anesthesia with the patient or authorized representative who has indicated his/her understanding and acceptance.   Dental advisory given  Plan Discussed with: CRNA  Anesthesia Plan Comments:         Anesthesia Quick Evaluation

## 2011-08-20 NOTE — Progress Notes (Signed)
Patient want s rest 30 more minutes before  Going home

## 2011-08-20 NOTE — Anesthesia Postprocedure Evaluation (Signed)
Anesthesia Post Note  Patient: Meghan Peterson  Procedure(s) Performed: Procedure(s) (LRB): LAPAROSCOPIC CHOLECYSTECTOMY WITH INTRAOPERATIVE CHOLANGIOGRAM (N/A)  Anesthesia type: General  Patient location: PACU  Post pain: Pain level controlled  Post assessment: Post-op Vital signs reviewed  Last Vitals:  Filed Vitals:   08/20/11 1100  BP: 144/72  Pulse: 76  Temp:   Resp: 18    Post vital signs: Reviewed  Level of consciousness: sedated  Complications: No apparent anesthesia complications

## 2011-08-20 NOTE — Interval H&P Note (Signed)
History and Physical Interval Note:  08/20/2011 8:26 AM  Meghan Peterson  has presented today for surgery, with the diagnosis of cholelithiasis   The various methods of treatment have been discussed with the patient and family. After consideration of risks, benefits and other options for treatment, the patient has consented to  Procedure(s) (LRB): LAPAROSCOPIC CHOLECYSTECTOMY WITH INTRAOPERATIVE CHOLANGIOGRAM (N/A) as a surgical intervention .  The patients' history has been reviewed, patient examined, no change in status, stable for surgery.  I have reviewed the patients' chart and labs.  Questions were answered to the patient's satisfaction.     Giacomo Valone

## 2011-08-20 NOTE — H&P (Signed)
HPI  This is a 56 year old female who is otherwise healthy who presents after having about 4 episodes this year of epigastric and right upper quadrant discomfort associated with nausea and emesis. She noted that when she has been eating at night she will lie down and then began to have some significant nausea as well as multiple episodes of emesis. This is associated with some epigastric and right upper quadrant discomfort also. This has happened 4 times since January. Her last episode was in February. She has changed her diet since then and is feels much better. She underwent a ultrasound which showed a fairly large gallstone and then was referred for possible surgery. She has no fevers associated with this. Eating certain foods does make it worse. She has gained some relief by changing her diet.   History reviewed. No pertinent past medical history.  Past Surgical History   Procedure  Date   .  Cesarean section  12/17/96   .  Laparoscopy  1997   .  Breast cyst excision  1978     left breast    Family History   Problem  Relation  Age of Onset   .  Kidney disease  Father    .  Cancer  Father       colon    .  Cancer  Maternal Aunt       breast    Social History  History   Substance Use Topics   .  Smoking status:  Former Smoker     Quit date:  04/22/1987   .  Smokeless tobacco:  Not on file   .  Alcohol Use:  Yes    No Known Allergies  Current Outpatient Prescriptions   Medication  Sig  Dispense  Refill   .  etonogestrel-ethinyl estradiol (NUVARING) 0.12-0.015 MG/24HR vaginal ring  Place 1 each vaginally every 28 (twenty-eight) days. Insert vaginally and leave in place for 3 consecutive weeks, then remove for 1 week.      Blood pressure 114/76, pulse 76, temperature 98 F (36.7 C), temperature source Temporal, resp. rate 16, height 5\' 6"  (1.676 m), weight 132 lb 12.8 oz (60.238 kg).  Physical Exam  Physical Exam  Vitals reviewed.  Eyes: No scleral icterus.  Cardiovascular:  Normal rate, regular rhythm and normal heart sounds.  Pulmonary/Chest: Effort normal and breath sounds normal. She has no wheezes. She has no rales.  Abdominal: Soft. Bowel sounds are normal. There is no hepatomegaly. There is no tenderness.    Data Reviewed  COMPLETE ABDOMINAL ULTRASOUND  Comparison: None.  Findings:  Gallbladder: 3.9 cm gallstone. Additional gallbladder  sludge/small stones. No pericholecystic fluid, gallbladder wall  thickening, or sonographic Murphy's sign.  Common bile duct: Measures 5 mm.  Liver: No focal lesion identified. Within normal limits in  parenchymal echogenicity.  IVC: Appears normal.  Pancreas: Incompletely visualized but grossly unremarkable.  Spleen: Poorly visualized. Measures approximately 4.6 cm.  Right Kidney: Measures 10.8 cm. No mass or hydronephrosis.  Left Kidney: Measures 10.0 cm. No mass or hydronephrosis  Abdominal aorta: No aneurysm identified.  IMPRESSION:  Cholelithiasis, without associated inflammatory changes to suggest  acute cholecystitis.  Assessment   Symptomatic cholelithiasis   Plan  I discussed the procedure in detail. The patient was given Agricultural engineer. We discussed the risks and benefits of a laparoscopic cholecystectomy and possible cholangiogram including, but not limited to bleeding, infection, injury to surrounding structures such as the intestine or liver, bile leak, retained gallstones, need to  convert to an open procedure, prolonged diarrhea, blood clots such as DVT, common bile duct injury, anesthesia risks, and possible need for additional procedures. The likelihood of improvement in symptoms and return to the patient's normal status is good. We discussed the typical post-operative recovery course.

## 2011-08-20 NOTE — Anesthesia Procedure Notes (Signed)
Procedure Name: Intubation Date/Time: 08/20/2011 8:34 AM Performed by: Doran Clay Pre-anesthesia Checklist: Patient identified, Timeout performed, Emergency Drugs available, Suction available and Patient being monitored Patient Re-evaluated:Patient Re-evaluated prior to inductionOxygen Delivery Method: Circle system utilized Preoxygenation: Pre-oxygenation with 100% oxygen Intubation Type: IV induction Ventilation: Mask ventilation without difficulty Laryngoscope Size: Mac and 4 Grade View: Grade I Tube type: Oral Tube size: 7.0 mm Number of attempts: 1 Airway Equipment and Method: Stylet Placement Confirmation: ETT inserted through vocal cords under direct vision,  breath sounds checked- equal and bilateral and positive ETCO2 Secured at: 21 cm Tube secured with: Tape Dental Injury: Teeth and Oropharynx as per pre-operative assessment

## 2011-08-20 NOTE — Transfer of Care (Signed)
Immediate Anesthesia Transfer of Care Note  Patient: Meghan Peterson  Procedure(s) Performed: Procedure(s) (LRB): LAPAROSCOPIC CHOLECYSTECTOMY WITH INTRAOPERATIVE CHOLANGIOGRAM (N/A)  Patient Location: PACU  Anesthesia Type: General  Level of Consciousness: sedated  Airway & Oxygen Therapy: Patient Spontanous Breathing and Patient connected to face mask oxygen  Post-op Assessment: Report given to PACU RN and Post -op Vital signs reviewed and stable  Post vital signs: Reviewed and stable  Complications: No apparent anesthesia complications

## 2011-08-20 NOTE — Progress Notes (Signed)
Oral airway out

## 2011-08-21 ENCOUNTER — Encounter (HOSPITAL_COMMUNITY): Payer: Self-pay | Admitting: General Surgery

## 2011-08-28 ENCOUNTER — Telehealth (INDEPENDENT_AMBULATORY_CARE_PROVIDER_SITE_OTHER): Payer: Self-pay

## 2011-08-28 NOTE — Telephone Encounter (Signed)
Pt calling b/c she is constipated from surgery. I advised pt to do the liquid milk of magnesia 4 tblsp. Now and then she might have to repeat the dose of milk of magnesia.

## 2011-09-03 ENCOUNTER — Encounter (INDEPENDENT_AMBULATORY_CARE_PROVIDER_SITE_OTHER): Payer: Self-pay

## 2011-09-17 ENCOUNTER — Ambulatory Visit (INDEPENDENT_AMBULATORY_CARE_PROVIDER_SITE_OTHER): Payer: 59 | Admitting: Obstetrics and Gynecology

## 2011-09-17 ENCOUNTER — Encounter: Payer: Self-pay | Admitting: Obstetrics and Gynecology

## 2011-09-17 VITALS — BP 122/70 | Ht 66.0 in | Wt 131.5 lb

## 2011-09-17 DIAGNOSIS — N951 Menopausal and female climacteric states: Secondary | ICD-10-CM

## 2011-09-17 DIAGNOSIS — D219 Benign neoplasm of connective and other soft tissue, unspecified: Secondary | ICD-10-CM | POA: Insufficient documentation

## 2011-09-17 DIAGNOSIS — Z124 Encounter for screening for malignant neoplasm of cervix: Secondary | ICD-10-CM

## 2011-09-17 DIAGNOSIS — D259 Leiomyoma of uterus, unspecified: Secondary | ICD-10-CM

## 2011-09-17 DIAGNOSIS — Z309 Encounter for contraceptive management, unspecified: Secondary | ICD-10-CM

## 2011-09-17 MED ORDER — ETONOGESTREL-ETHINYL ESTRADIOL 0.12-0.015 MG/24HR VA RING: VAGINAL_RING | VAGINAL | Status: DC

## 2011-09-17 NOTE — Progress Notes (Signed)
Last Pap: 07/07/2008 WNL: Yes Regular Periods:yes Contraception: Nuvaring  Monthly Breast exam:yes Tetanus<59yrs:yes Nl.Bladder Function:yes Daily BMs:yes Healthy Diet:yes Calcium:no Mammogram:yes 01/22/2011@[facility]  Exercise:yes occ Seatbelt: yes Abuse at home: no Stressful work:no Sigmoid-colonoscopy: 2012-wnl per pt @[facility] (? Physician name) Bone Density: No BMI= Cholecystectomy 08/18/11  Subjective:    ROSANGELICA Peterson is a 56 y.o. female, G1P0101, who presents for an annual exam. Occassional hot flashes while off Nuvaring.  Wants to continue Nuvaring   History   Social History  . Marital Status: Single    Spouse Name: N/A    Number of Children: N/A  . Years of Education: N/A   Social History Main Topics  . Smoking status: Former Smoker    Quit date: 04/22/1987  . Smokeless tobacco: Never Used  . Alcohol Use: Yes     OCCAS  . Drug Use: No  . Sexually Active: Yes    Birth Control/ Protection: Inserts   Other Topics Concern  . None   Social History Narrative  . None    Menstrual cycle:   LMP: Patient's last menstrual period was 08/18/2011.           Cycle: monthly withdrawal from Nuvaring  The following portions of the patient's history were reviewed and updated as appropriate: allergies, current medications, past family history, past medical history, past social history, past surgical history and problem list.  Review of Systems Pertinent items are noted in HPI. Breast:Negative for breast lump,nipple discharge or nipple retraction Gastrointestinal: Negative for abdominal pain, change in bowel habits or rectal bleeding Urinary:negative   Objective:    BP 122/70  Ht 5\' 6"  (1.676 m)  Wt 131 lb 8 oz (59.648 kg)  BMI 21.22 kg/m2  LMP 08/18/2011    Weight:  Wt Readings from Last 1 Encounters:  09/17/11 131 lb 8 oz (59.648 kg)          BMI: Body mass index is 21.22 kg/(m^2).  General Appearance: Alert, appropriate appearance for age. No acute  distress HEENT: Grossly normal Neck / Thyroid: Supple, no masses, nodes or enlargement Lungs: clear to auscultation bilaterally Back: No CVA tenderness Breast Exam: No masses or nodes.No dimpling, nipple retraction or discharge. Cardiovascular: Regular rate and rhythm. S1, S2, no murmur Gastrointestinal: Soft, non-tender, no masses or organomegaly Pelvic Exam: External genitalia: normal general appearance Vaginal: normal mucosa without prolapse or lesions Cervix: normal appearance Adnexa: non palpable Uterus: ULNS nontender Rectovaginal: normal rectal, no masses Lymphatic Exam: Non-palpable nodes in neck, clavicular, axillary, or inguinal regions Skin: no rash or abnormalities Neurologic: Normal gait and speech, no tremor  Psychiatric: Alert and oriented, appropriate affect.   Wet Prep:not applicable Urinalysis:not applicable UPT: Not done   Assessment:    Perimenopausal age range  Asymptomatic, stable fibroids  Plan:    mammogram pap smear with HR HPV return annually or prn FSH and Estradiol on 09/22/11 (while Nuvaring is out).  If menopausal, continue Nuvaring for 1 additional year then consider lower dose HrT Contraception:NuvaRing vaginal inserts      Meghan Peterson PMD

## 2011-09-18 ENCOUNTER — Ambulatory Visit (INDEPENDENT_AMBULATORY_CARE_PROVIDER_SITE_OTHER): Payer: 59 | Admitting: General Surgery

## 2011-09-18 ENCOUNTER — Encounter (INDEPENDENT_AMBULATORY_CARE_PROVIDER_SITE_OTHER): Payer: Self-pay | Admitting: General Surgery

## 2011-09-18 VITALS — BP 115/81 | HR 90 | Temp 98.0°F | Ht 66.5 in | Wt 129.4 lb

## 2011-09-18 DIAGNOSIS — Z09 Encounter for follow-up examination after completed treatment for conditions other than malignant neoplasm: Secondary | ICD-10-CM

## 2011-09-18 NOTE — Progress Notes (Signed)
Subjective:     Patient ID: Meghan Peterson, female   DOB: 1955/05/16, 56 y.o.   MRN: [id number]  HPI 13 yof who underwent lap chole with nl cholangiogram a month ago for symptomatic cholelithiasis.  She is doing well now without complaints.  Her path shows chronic cholecystitis and cholelithaisis.  Review of Systems     Objective:   Physical Exam Healing incisions without infection    Assessment:     S/p lap chole    Plan:     Return to full activity and return to see me as needed

## 2011-09-22 ENCOUNTER — Other Ambulatory Visit: Payer: 59

## 2011-09-22 DIAGNOSIS — N951 Menopausal and female climacteric states: Secondary | ICD-10-CM

## 2011-09-22 LAB — PAP IG AND HPV HIGH-RISK

## 2011-10-01 ENCOUNTER — Telehealth: Payer: Self-pay

## 2011-10-01 NOTE — Telephone Encounter (Signed)
Tc to pt per test results. Told pt FSH and Estradiol=menopausal. Pt needs an additional year of Nuvaring coverage. Pt voices understanding.

## 2011-10-14 ENCOUNTER — Telehealth: Payer: Self-pay

## 2011-10-14 NOTE — Telephone Encounter (Signed)
Tc from pt per telephone call. Told pt pap smear-abnormal. No cancer found. Pt needs colposcopy per [facility]. Procedure explained to pt in detail. Appt sched 10-30-11@3 :30 with [facility]. Informed pt no ic or anything pv 24 hours prior to the appt and can take otc Tylenol or Ibuprofen prior to procedure. Pt voices understanding.

## 2011-10-14 NOTE — Telephone Encounter (Signed)
Lm on vm to cb per pap smear results.  

## 2011-10-30 ENCOUNTER — Ambulatory Visit (INDEPENDENT_AMBULATORY_CARE_PROVIDER_SITE_OTHER): Payer: 59 | Admitting: Obstetrics and Gynecology

## 2011-10-30 ENCOUNTER — Encounter: Payer: Self-pay | Admitting: Obstetrics and Gynecology

## 2011-10-30 VITALS — BP 124/68 | Ht 66.5 in | Wt 132.0 lb

## 2011-10-30 DIAGNOSIS — IMO0002 Reserved for concepts with insufficient information to code with codable children: Secondary | ICD-10-CM | POA: Insufficient documentation

## 2011-10-30 DIAGNOSIS — R87811 Vaginal high risk human papillomavirus (HPV) DNA test positive: Secondary | ICD-10-CM

## 2011-10-30 NOTE — Progress Notes (Signed)
Previous Pap Smear: 09/17/2011 ASCUS/HPV Previous Colposcopy: n/a Referred From: [facility] LMP: 10/14/2011 Contraception: NUVARING G,P: 1,1 Colposcopy Procedure Note  Indications: Pap smear 2 months ago showed: ASCUS with POSITIVE high risk HPV. The prior pap showed no abnormalities.  Prior cervical/vaginal disease: {None Prior cervical treatment: no treatment.  Procedure Details  The risks and benefits of the procedure and Written informed consent obtained.  Speculum placed in vagina and excellent visualization of cervix achieved, cervix swabbed x 3 with acetic acid solution.  Findings:  Cervix:no colposcopic abnormalities.exam with endocervical speculum reveals a lesion and that may be consistent with an endocervical polyp .Vaginal inspection: normal without visible lesions. Vulvar colposcopy: vulvar colposcopy not performed.  Specimens: endocervical curettings with possible endocervical polyp  Complications: none.  Plan: Specimens labelled and sent to Pathology. Will base further treatment on Pathology findings. Treatment options discussed with patient. Will notify patient of results by phone

## 2011-11-04 ENCOUNTER — Telehealth: Payer: Self-pay

## 2011-11-04 NOTE — Telephone Encounter (Signed)
Tc to pt per test results. Told pt endocervical polyp=benign. Repeat pap smear due 04/2012. Pt put on recall list for 6 month reminder for repeat pap smear. Pt voices understanding.

## 2012-02-10 ENCOUNTER — Telehealth: Payer: Self-pay | Admitting: Obstetrics and Gynecology

## 2012-02-11 ENCOUNTER — Ambulatory Visit (INDEPENDENT_AMBULATORY_CARE_PROVIDER_SITE_OTHER): Payer: 59 | Admitting: Obstetrics and Gynecology

## 2012-02-11 ENCOUNTER — Encounter: Payer: Self-pay | Admitting: Obstetrics and Gynecology

## 2012-02-11 VITALS — BP 112/72 | Temp 98.6°F | Wt 135.0 lb

## 2012-02-11 DIAGNOSIS — N898 Other specified noninflammatory disorders of vagina: Secondary | ICD-10-CM

## 2012-02-11 DIAGNOSIS — N390 Urinary tract infection, site not specified: Secondary | ICD-10-CM

## 2012-02-11 DIAGNOSIS — B373 Candidiasis of vulva and vagina: Secondary | ICD-10-CM

## 2012-02-11 DIAGNOSIS — R350 Frequency of micturition: Secondary | ICD-10-CM

## 2012-02-11 LAB — POCT URINALYSIS DIPSTICK
Bilirubin, UA: NEGATIVE
Glucose, UA: NEGATIVE
Ketones, UA: NEGATIVE
Spec Grav, UA: 1.015

## 2012-02-11 LAB — POCT WET PREP (WET MOUNT)

## 2012-02-11 MED ORDER — FLUCONAZOLE 150 MG PO TABS: 150.0000 mg | ORAL_TABLET | Freq: Once | ORAL | Status: AC

## 2012-02-11 NOTE — Progress Notes (Signed)
56 YO complains of urinary frequency x 4 days and vaginitis symptoms.  Admits to recent antibiotic therapy.  O: Pelvic: EGBUS-wnl, vagina-copious yellow clumpy vaginal discharge, cervix-no lesions, uterus-ULNS, no tenderness      adnexae-no masses  Wet Prep: pH-5.0,  whiff-negative, many yeast  A: Candida Vaginitis  P: Diflucan 150 mg  #1 1 po stat 1 refill      Perineal hygiene      RTO-as scheduled or prn  Shirlyn Savin, PA-C

## 2012-02-11 NOTE — Patient Instructions (Signed)
Avoid: - excess soap on genital area (consider using plain oatmeal soap) - use of powder or sprays in genital area - douching - wearing underwear to bed (except with menses) - using more than is directed detergent when washing clothes - tight fitting garments around genital area - excess sugar intake   

## 2012-02-11 NOTE — Progress Notes (Signed)
Color: clear Odor: no Itching:yes Thin:yes Thick:no Fever:no Dyspareunia:no Hx PID:no HX STD:no Pelvic Pain:no Desires Gc/CT:no Desires HIV,RPR,HbsAG:no  Pt states she is going to the bathroom more than usual;pt want urine checked

## 2012-02-13 LAB — URINE CULTURE: Organism ID, Bacteria: NO GROWTH

## 2012-07-27 ENCOUNTER — Other Ambulatory Visit: Payer: Self-pay | Admitting: Obstetrics and Gynecology

## 2012-07-27 DIAGNOSIS — Z1231 Encounter for screening mammogram for malignant neoplasm of breast: Secondary | ICD-10-CM

## 2012-08-04 ENCOUNTER — Ambulatory Visit (HOSPITAL_COMMUNITY): Payer: 59

## 2012-08-09 ENCOUNTER — Ambulatory Visit (HOSPITAL_COMMUNITY)
Admission: RE | Admit: 2012-08-09 | Discharge: 2012-08-09 | Disposition: A | Discharge status: Home / Self Care | Payer: 59 | Source: Ambulatory Visit | Attending: Obstetrics and Gynecology | Admitting: Obstetrics and Gynecology

## 2012-08-09 DIAGNOSIS — Z1231 Encounter for screening mammogram for malignant neoplasm of breast: Secondary | ICD-10-CM

## 2012-08-09 DIAGNOSIS — Z124 Encounter for screening for malignant neoplasm of cervix: Secondary | ICD-10-CM | POA: Insufficient documentation

## 2013-03-12 IMAGING — RF DG CHOLANGIOGRAM OPERATIVE
1 series · 4 of 4 positions shown · non-contrast
Comparison: Ultrasound 06/13/2011

CLINICAL DATA: Cholelithiasis

INTRAOPERATIVE CHOLANGIOGRAM
TECHNIQUE: Cholangiographic images from the C-arm fluoroscopic
device were submitted for interpretation post-operatively.  Please
see the procedural report for the amount of contrast and the
fluoroscopy time utilized.

[Series 1: run · 4 of 51 frames shown]
[frame 8/51]
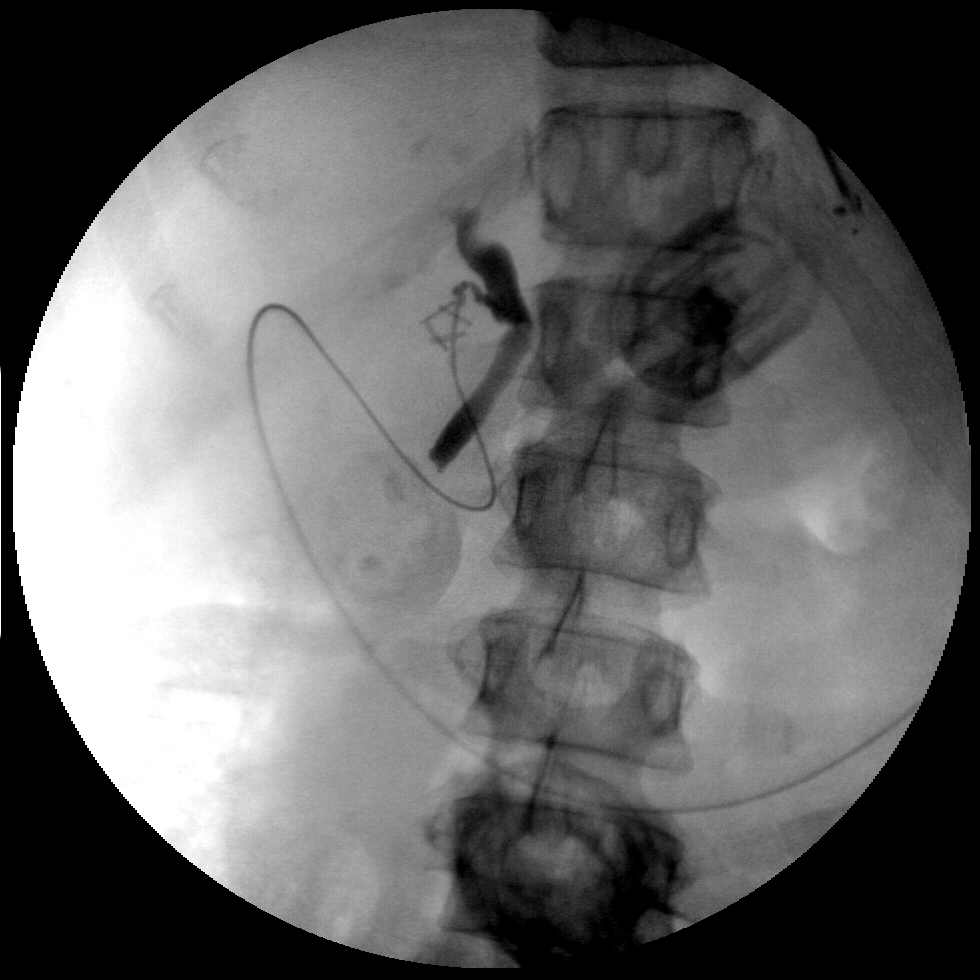
[frame 22/51]
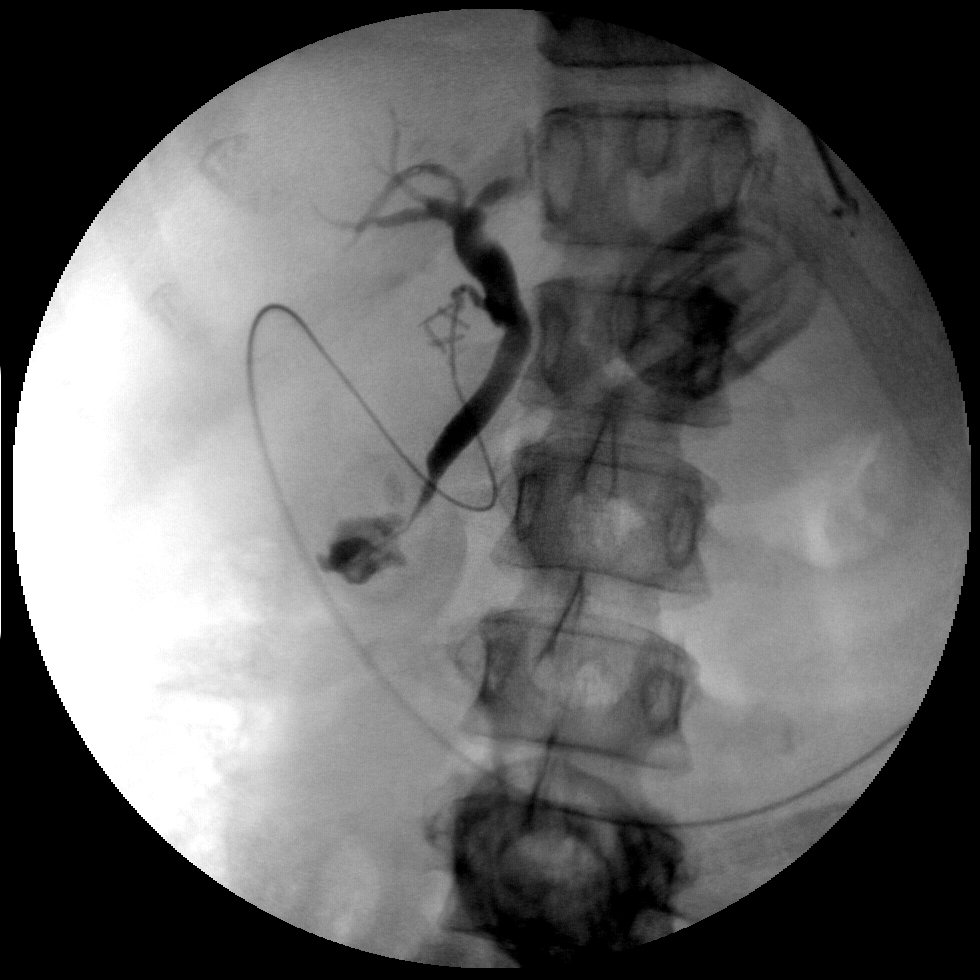
[frame 26/51]
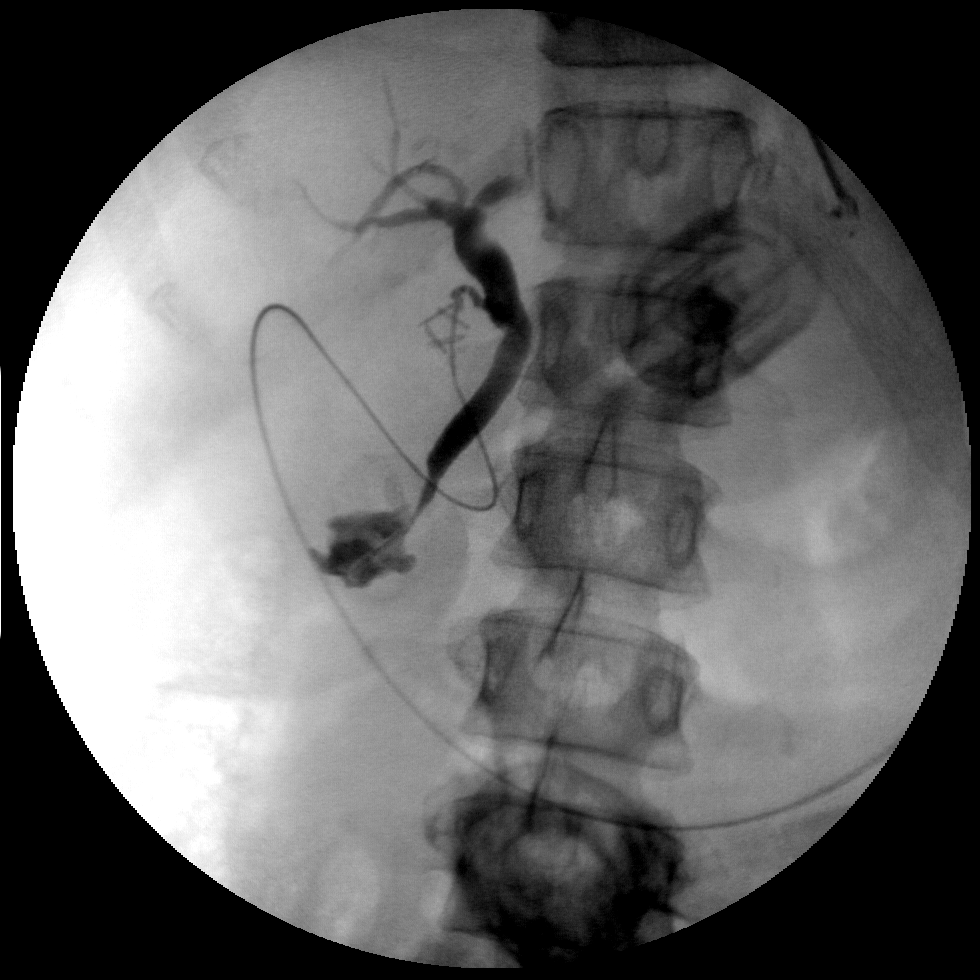
[frame 44/51]
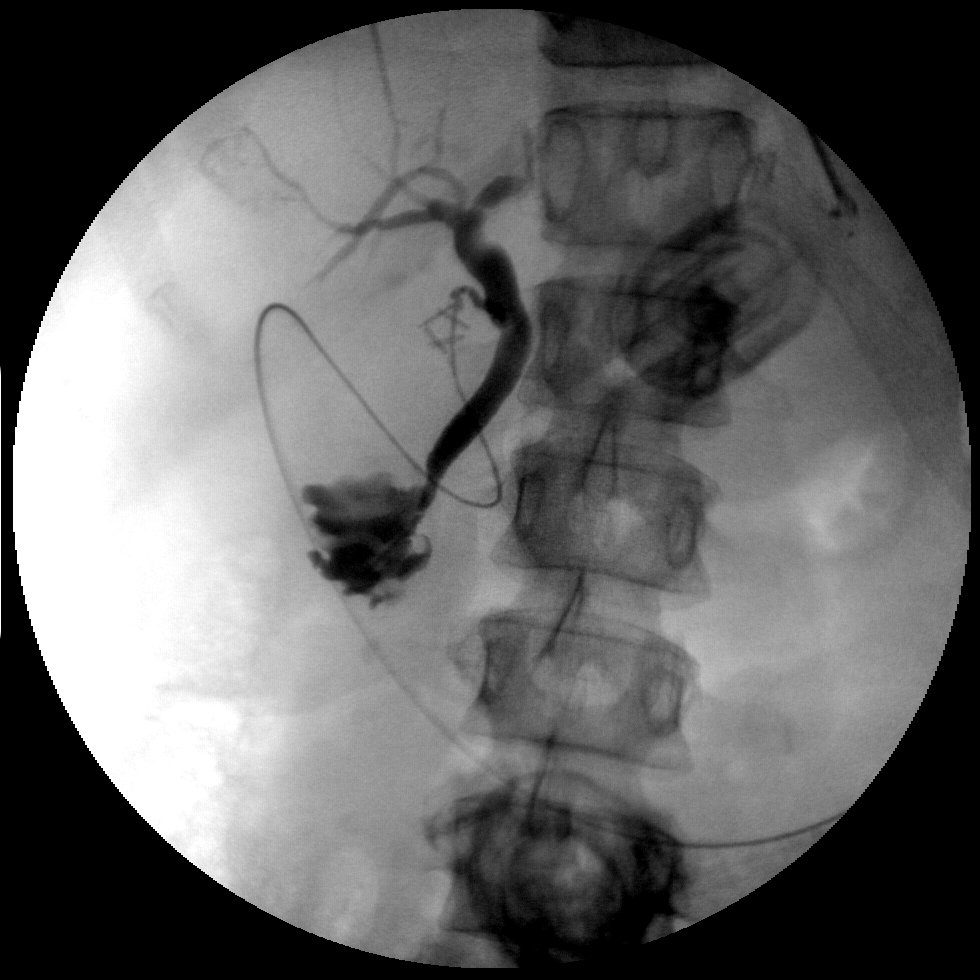

[4 of 4 positions shown; findings below may reference images not displayed]

FINDINGS: Multiple spot fluoroscopic intraoperative views are
provided.  Initial image demonstrates cannulation of the cystic
duct and injection of contrast.  Contrast flows into the duodenum
without evidence of obstruction.  No filling defects within the
common bile duct.  No biliary ductal dilatation.
IMPRESSION: No evidence of filling defect in the common bile duct or common
bile duct obstruction.

## 2013-09-29 ENCOUNTER — Other Ambulatory Visit: Payer: Self-pay | Admitting: Obstetrics and Gynecology

## 2013-09-29 DIAGNOSIS — Z1231 Encounter for screening mammogram for malignant neoplasm of breast: Secondary | ICD-10-CM

## 2013-10-04 ENCOUNTER — Ambulatory Visit (HOSPITAL_COMMUNITY)
Admission: RE | Admit: 2013-10-04 | Discharge: 2013-10-04 | Disposition: A | Discharge status: Home / Self Care | Payer: 59 | Source: Ambulatory Visit | Attending: Obstetrics and Gynecology | Admitting: Obstetrics and Gynecology

## 2013-10-04 DIAGNOSIS — Z1231 Encounter for screening mammogram for malignant neoplasm of breast: Secondary | ICD-10-CM

## 2014-02-20 ENCOUNTER — Encounter: Payer: Self-pay | Admitting: Obstetrics and Gynecology

## 2014-11-06 ENCOUNTER — Other Ambulatory Visit (HOSPITAL_COMMUNITY): Payer: Self-pay | Admitting: Obstetrics and Gynecology

## 2014-11-06 DIAGNOSIS — Z1231 Encounter for screening mammogram for malignant neoplasm of breast: Secondary | ICD-10-CM

## 2014-11-07 ENCOUNTER — Ambulatory Visit (HOSPITAL_COMMUNITY)
Admission: RE | Admit: 2014-11-07 | Discharge: 2014-11-07 | Disposition: A | Discharge status: Home / Self Care | Payer: 59 | Source: Ambulatory Visit | Attending: Obstetrics and Gynecology | Admitting: Obstetrics and Gynecology

## 2014-11-07 DIAGNOSIS — Z1231 Encounter for screening mammogram for malignant neoplasm of breast: Secondary | ICD-10-CM | POA: Diagnosis not present

## 2015-10-17 ENCOUNTER — Other Ambulatory Visit: Payer: Self-pay | Admitting: Obstetrics and Gynecology

## 2015-10-17 DIAGNOSIS — Z1231 Encounter for screening mammogram for malignant neoplasm of breast: Secondary | ICD-10-CM

## 2015-11-12 ENCOUNTER — Ambulatory Visit
Admission: RE | Admit: 2015-11-12 | Discharge: 2015-11-12 | Disposition: A | Discharge status: Home / Self Care | Payer: 59 | Source: Ambulatory Visit | Attending: Obstetrics and Gynecology | Admitting: Obstetrics and Gynecology

## 2015-11-12 DIAGNOSIS — Z1231 Encounter for screening mammogram for malignant neoplasm of breast: Secondary | ICD-10-CM

## 2016-09-10 DIAGNOSIS — I10 Essential (primary) hypertension: Secondary | ICD-10-CM | POA: Diagnosis not present

## 2016-09-10 DIAGNOSIS — Z0000 Encounter for general adult medical examination without abnormal findings: Secondary | ICD-10-CM | POA: Diagnosis not present

## 2016-09-10 DIAGNOSIS — G4726 Circadian rhythm sleep disorder, shift work type: Secondary | ICD-10-CM | POA: Diagnosis not present

## 2016-10-03 ENCOUNTER — Other Ambulatory Visit: Payer: Self-pay | Admitting: Obstetrics and Gynecology

## 2016-10-03 DIAGNOSIS — Z1231 Encounter for screening mammogram for malignant neoplasm of breast: Secondary | ICD-10-CM

## 2016-11-19 ENCOUNTER — Ambulatory Visit
Admission: RE | Admit: 2016-11-19 | Discharge: 2016-11-19 | Disposition: A | Discharge status: Home / Self Care | Payer: 59 | Source: Ambulatory Visit | Attending: Obstetrics and Gynecology | Admitting: Obstetrics and Gynecology

## 2016-11-19 DIAGNOSIS — Z1231 Encounter for screening mammogram for malignant neoplasm of breast: Secondary | ICD-10-CM | POA: Diagnosis not present

## 2016-12-29 DIAGNOSIS — Z01411 Encounter for gynecological examination (general) (routine) with abnormal findings: Secondary | ICD-10-CM | POA: Diagnosis not present

## 2016-12-29 DIAGNOSIS — N951 Menopausal and female climacteric states: Secondary | ICD-10-CM | POA: Diagnosis not present

## 2016-12-29 DIAGNOSIS — G4700 Insomnia, unspecified: Secondary | ICD-10-CM | POA: Diagnosis not present

## 2017-05-25 DIAGNOSIS — I10 Essential (primary) hypertension: Secondary | ICD-10-CM | POA: Diagnosis not present

## 2017-05-25 DIAGNOSIS — G44209 Tension-type headache, unspecified, not intractable: Secondary | ICD-10-CM | POA: Diagnosis not present

## 2017-05-25 DIAGNOSIS — G4726 Circadian rhythm sleep disorder, shift work type: Secondary | ICD-10-CM | POA: Diagnosis not present

## 2017-05-27 ENCOUNTER — Ambulatory Visit: Payer: Self-pay | Admitting: Podiatry

## 2017-05-27 ENCOUNTER — Encounter: Payer: Self-pay | Admitting: Podiatry

## 2017-05-27 ENCOUNTER — Ambulatory Visit (INDEPENDENT_AMBULATORY_CARE_PROVIDER_SITE_OTHER): Payer: 59 | Admitting: Podiatry

## 2017-05-27 DIAGNOSIS — Q828 Other specified congenital malformations of skin: Secondary | ICD-10-CM

## 2017-05-27 DIAGNOSIS — M21969 Unspecified acquired deformity of unspecified lower leg: Secondary | ICD-10-CM | POA: Diagnosis not present

## 2017-05-27 DIAGNOSIS — B351 Tinea unguium: Secondary | ICD-10-CM

## 2017-05-27 DIAGNOSIS — M7740 Metatarsalgia, unspecified foot: Secondary | ICD-10-CM | POA: Diagnosis not present

## 2017-05-27 MED ORDER — CICLOPIROX 8 % EX SOLN: Freq: Every day | CUTANEOUS | 3 refills | Status: DC

## 2017-05-27 NOTE — Patient Instructions (Signed)
Seen for painful calluses. Noted of unstable first ray with bunion deformity. All lesions debrided. Return as needed.

## 2017-05-27 NOTE — Progress Notes (Signed)
SUBJECTIVE: 62 y.o. year old female presents complaining of left foot pain. Stated that she has left foot plantar callus since age 77 that needs be trimmed. If not tended, will affect balance and massing her knees as well. Has had the lesion removed at age 42 and the lesion returned.  Review of Systems  Constitutional: Negative.   HENT: Negative.   Eyes: Negative.   Respiratory: Negative.   Cardiovascular: Negative.   Gastrointestinal: Negative.   Genitourinary: Negative.   Musculoskeletal: Negative.   Skin: Negative.     OBJECTIVE: DERMATOLOGIC EXAMINATION: Mycotic nails both great toe. Plantar callus under sub 2 left, 4th right toe.  VASCULAR EXAMINATION OF LOWER LIMBS: All pedal pulses are palpable with normal pulsation.   Capillary Filling times within 3 seconds in all digits.  No edema or erythema noted. Temperature gradient from tibial crest to dorsum of foot is within normal bilateral.  NEUROLOGIC EXAMINATION OF THE LOWER LIMBS: All epicritic and tactile sensations grossly intact. Sharp and Dull discriminatory sensations at the plantar ball of hallux is intact bilateral.   MUSCULOSKELETAL EXAMINATION: Positive for HAV with bunion, hypermobile first ray bilateral.  Hammer toe with corn 4th right.  ASSESSMENT: Painful porokeratosis sub 2 left. HAV with bunion bilateral. Hypermobile first ray bilateral. Onychomycosis both great toes.  PLAN: Reviewed clinical findings and available treatment options. All painful corns and calluses debrided. Rx Penlac for fungal nail. Return as need for routine foot care.

## 2017-09-15 ENCOUNTER — Ambulatory Visit: Payer: 59 | Admitting: Podiatry

## 2017-09-15 ENCOUNTER — Encounter: Payer: Self-pay | Admitting: Podiatry

## 2017-09-15 DIAGNOSIS — Q828 Other specified congenital malformations of skin: Secondary | ICD-10-CM

## 2017-09-15 DIAGNOSIS — M21969 Unspecified acquired deformity of unspecified lower leg: Secondary | ICD-10-CM | POA: Diagnosis not present

## 2017-09-15 DIAGNOSIS — M7740 Metatarsalgia, unspecified foot: Secondary | ICD-10-CM

## 2017-09-15 NOTE — Progress Notes (Signed)
Subjective: 62 y.o. year old female patient presents complaining of painful calluses and request to be trimmed. On feet all day wearing boot [place].  Objective: Dermatologic: Painful porokeratotic lesions sub 2 left, 4th right. Vascular: Pedal pulses are all palpable. Orthopedic: Severe Hallux valgus and bunion bilateral. Hypermobile first ray bilateral. Hammer toe 4th right. Neurologic: All epicritic and tactile sensations grossly intact.  Assessment: Plantar porokeratosis bilateral.  Treatment: All painful lesions debrided.  Return as needed.

## 2017-09-15 NOTE — Patient Instructions (Signed)
Seen for painful calluses. All lesions debrided. May benefit from custom orthotics.

## 2017-09-23 ENCOUNTER — Telehealth: Payer: Self-pay | Admitting: *Deleted

## 2017-09-23 DIAGNOSIS — I10 Essential (primary) hypertension: Secondary | ICD-10-CM | POA: Diagnosis not present

## 2017-09-23 DIAGNOSIS — G4726 Circadian rhythm sleep disorder, shift work type: Secondary | ICD-10-CM | POA: Diagnosis not present

## 2017-09-23 DIAGNOSIS — Z0000 Encounter for general adult medical examination without abnormal findings: Secondary | ICD-10-CM | POA: Diagnosis not present

## 2017-09-23 NOTE — Telephone Encounter (Signed)
Orthotics are not covered by insurance. Called patient and let her know.Advised that we could still bill insurance and once she gets the bill we could set up a payment plan. She does not want to proceed in getting the orthotics at this time.

## 2017-10-27 ENCOUNTER — Other Ambulatory Visit: Payer: Self-pay | Admitting: Internal Medicine

## 2017-10-27 ENCOUNTER — Other Ambulatory Visit: Payer: Self-pay | Admitting: Family Medicine

## 2017-10-27 DIAGNOSIS — Z1231 Encounter for screening mammogram for malignant neoplasm of breast: Secondary | ICD-10-CM

## 2017-11-30 ENCOUNTER — Encounter: Payer: Self-pay | Admitting: Cardiology

## 2017-11-30 ENCOUNTER — Ambulatory Visit
Admission: RE | Admit: 2017-11-30 | Discharge: 2017-11-30 | Disposition: A | Discharge status: Home / Self Care | Payer: 59 | Source: Ambulatory Visit | Attending: Family Medicine | Admitting: Family Medicine

## 2017-11-30 ENCOUNTER — Ambulatory Visit: Payer: 59 | Admitting: Cardiology

## 2017-11-30 VITALS — BP 122/72 | HR 82 | Ht 66.5 in | Wt 145.4 lb

## 2017-11-30 DIAGNOSIS — Z1231 Encounter for screening mammogram for malignant neoplasm of breast: Secondary | ICD-10-CM | POA: Diagnosis not present

## 2017-11-30 DIAGNOSIS — Z8249 Family history of ischemic heart disease and other diseases of the circulatory system: Secondary | ICD-10-CM | POA: Diagnosis not present

## 2017-11-30 DIAGNOSIS — R600 Localized edema: Secondary | ICD-10-CM

## 2017-11-30 NOTE — Progress Notes (Signed)
Cardiology Office Note:    Date:  11/30/2017   ID:  Meghan Peterson, DOB [birth year], MRN [id number]  PCP:  Donald Prose, MD  Cardiologist:  No primary care provider on file.   Referring MD: Maury Dus, MD    History of Present Illness:    Meghan Peterson is a 62 y.o. female here for cardiac risk stratification at the request of Dr. Mamie Levers.  Overall she is fairly active without any significant symptoms, no significant chest pain fevers chills nausea vomiting syncope bleeding.  She does have some mild ankle edema at the end of the day.  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  She takes care of her 93 year old autistic son.  She does have a family history of coronary artery disease but no early CAD.  Her mother is alive, 43 years old, still smokes.  She quit smoking in 1989.  Occasionally she may have a mild twinge of chest discomfort but nothing severe, atypical.  No syncope.  Her risk factors at this point include hypertension, she is taking a half of a tablet of losartan a day.     Past Medical History:  Diagnosis Date  . Abnormal perimenopausal bleeding   . ASCUS with positive high risk HPV   . Atypical squamous cells of undetermined significance (ASCUS) on Papanicolaou smear of cervix   . Endometriosis   . Fibroids   . Herpes simplex type 2 infection   . Herpes simplex type II infection 10/27/2007   date of dx  . HPV (human papilloma virus) infection   . Hx: UTI (urinary tract infection)   . Menopausal symptoms   . Uterine leiomyoma   . Venereal disease     Past Surgical History:  Procedure Laterality Date  . BREAST CYST EXCISION  1978   left breast  . CESAREAN SECTION  12/17/96  . CHOLECYSTECTOMY  08/20/2011   Procedure: LAPAROSCOPIC CHOLECYSTECTOMY WITH INTRAOPERATIVE CHOLANGIOGRAM;  Surgeon: Rolm Bookbinder, MD;  Location: [facility];  Service: General;  Laterality: N/A;  . LAPAROSCOPY  1997  . MYOMECTOMY  1997  . WISDOM TOOTH  EXTRACTION      Current Medications: Current Meds  Medication Sig  . losartan-hydrochlorothiazide (HYZAAR) 100-12.5 MG tablet Take 0.5 tablets by mouth daily.     Allergies:   Patient has no known allergies.   Social History   Socioeconomic History  . Marital status: Single    Spouse name: Not on file  . Number of children: Not on file  . Years of education: Not on file  . Highest education level: Not on file  Occupational History  . Not on file  Social Needs  . Financial resource strain: Not on file  . Food insecurity:    Worry: Not on file    Inability: Not on file  . Transportation needs:    Medical: Not on file    Non-medical: Not on file  Tobacco Use  . Smoking status: Former Smoker    Last attempt to quit: 04/22/1987    Years since quitting: 30.6  . Smokeless tobacco: Never Used  Substance and Sexual Activity  . Alcohol use: Yes    Comment: OCCAS  . Drug use: No  . Sexual activity: Yes    Birth control/protection: Inserts    Comment: nuvaring  Lifestyle  . Physical activity:    Days per week: Not on file    Minutes per session: Not on file  . Stress:  Not on file  Relationships  . Social connections:    Talks on phone: Not on file    Gets together: Not on file    Attends religious service: Not on file    Active member of club or organization: Not on file    Attends meetings of clubs or organizations: Not on file    Relationship status: Not on file  Other Topics Concern  . Not on file  Social History Narrative  . Not on file     Family History: The patient's family history includes Breast cancer in her maternal aunt and maternal aunt; Cancer in her father and maternal aunt; Hypertension in her maternal aunt; Kidney disease in her father.  ROS:   Please see the history of present illness.     All other systems reviewed and are negative.  EKGs/Labs/Other Studies Reviewed:    The following studies were reviewed today: Prior lab work reviewed, LDL  105, HDL 52, triglycerides 38, hemoglobin 13.7, creatinine 0.7, potassium 4.1, ALT 12, TSH 0.8.  EKG:  EKG is  ordered today.  The ekg ordered today demonstrates sinus rhythm 75 with no other abnormalities. Personally viewed  Recent Labs: No results found for requested labs within last 8760 hours.  Recent Lipid Panel No results found for: CHOL, TRIG, HDL, CHOLHDL, VLDL, LDLCALC, LDLDIRECT  Physical Exam:    VS:  BP 122/72   Pulse 82   Ht 5' 6.5" (1.689 m)   Wt 145 lb 6.4 oz (66 kg)   LMP 09/09/2013   SpO2 99%   BMI 23.12 kg/m     Wt Readings from Last 3 Encounters:  11/30/17 145 lb 6.4 oz (66 kg)  02/11/12 135 lb (61.2 kg)  10/30/11 132 lb (59.9 kg)     GEN:  Well nourished, well developed in no acute distress HEENT: Normal NECK: No JVD; No carotid bruits LYMPHATICS: No lymphadenopathy CARDIAC: RRR, no murmurs, rubs, gallops RESPIRATORY:  Clear to auscultation without rales, wheezing or rhonchi  ABDOMEN: Soft, non-tender, non-distended MUSCULOSKELETAL:  No edema; No deformity  SKIN: Warm and dry NEUROLOGIC:  Alert and oriented x 3 PSYCHIATRIC:  Normal affect   ASSESSMENT:    1. Family history of cardiovascular disease   2. Lower extremity edema    PLAN:    In order of problems listed above:  Cardiac risk - Relatively asymptomatic however she does state that she has a family history of CAD but no early MI.  She has hypertension, former smoker quit several years ago.  She is interested in coronary calcium score.  If coronary calcium is present, we will likely intensify therapy with statin.  Continue with diet, exercise.  Mild lower extremity edema - Could consider TED hose especially during work.  This will be helpful for her dependent edema.  Watch salt.  We will follow-up with results of testing.   Medication Adjustments/Labs and Tests Ordered: Current medicines are reviewed at length with the patient today.  Concerns regarding medicines are outlined above.    Orders Placed This Encounter  Procedures  . CT CARDIAC SCORING  . EKG 12-Lead   No orders of the defined types were placed in this encounter.   Patient Instructions  Medication Instructions:  The current medical regimen is effective;  continue present plan and medications.  Testing/Procedures: Your physician has requested that you have Calcium score by Cardiac CT.  Computed tomography (CT) is a painless test that uses an x-ray machine to take clear, detailed pictures of  your heart. There are no instructions for this test.  Follow-Up: Follow up will be based on the results of the above testing.  Thank you for choosing [facility]!!        Signed, Candee Furbish, MD  11/30/2017 10:10 AM    [place]

## 2017-11-30 NOTE — Patient Instructions (Signed)
Medication Instructions:  The current medical regimen is effective;  continue present plan and medications.  Testing/Procedures: Your physician has requested that you have Calcium score by Cardiac CT.  Computed tomography (CT) is a painless test that uses an x-ray machine to take clear, detailed pictures of your heart. There are no instructions for this test.  Follow-Up: Follow up will be based on the results of the above testing.  Thank you for choosing [facility]!!

## 2017-12-31 DIAGNOSIS — Z01411 Encounter for gynecological examination (general) (routine) with abnormal findings: Secondary | ICD-10-CM | POA: Diagnosis not present

## 2017-12-31 DIAGNOSIS — Z6822 Body mass index (BMI) 22.0-22.9, adult: Secondary | ICD-10-CM | POA: Diagnosis not present

## 2017-12-31 DIAGNOSIS — Z124 Encounter for screening for malignant neoplasm of cervix: Secondary | ICD-10-CM | POA: Diagnosis not present

## 2018-01-04 ENCOUNTER — Ambulatory Visit (INDEPENDENT_AMBULATORY_CARE_PROVIDER_SITE_OTHER)
Admission: RE | Admit: 2018-01-04 | Discharge: 2018-01-04 | Disposition: A | Discharge status: Home / Self Care | Payer: Self-pay | Source: Ambulatory Visit | Attending: Cardiology | Admitting: Cardiology

## 2018-01-04 DIAGNOSIS — Z8249 Family history of ischemic heart disease and other diseases of the circulatory system: Secondary | ICD-10-CM

## 2018-01-07 ENCOUNTER — Telehealth: Payer: Self-pay | Admitting: Cardiology

## 2018-01-07 DIAGNOSIS — R931 Abnormal findings on diagnostic imaging of heart and coronary circulation: Secondary | ICD-10-CM

## 2018-01-07 DIAGNOSIS — Z79899 Other long term (current) drug therapy: Secondary | ICD-10-CM

## 2018-01-07 MED ORDER — ATORVASTATIN CALCIUM 40 MG PO TABS: 40.0000 mg | ORAL_TABLET | Freq: Every day | ORAL | 3 refills | Status: DC

## 2018-01-07 NOTE — Telephone Encounter (Signed)
New message ° ° °Patient is calling for test results.  °

## 2018-01-07 NOTE — Telephone Encounter (Signed)
Pt aware of results.  Agreeable to start atorvastatin and repeat lab in 2 months.  Orders placed and lab appt scheduled for 03/10/18.

## 2018-01-07 NOTE — Telephone Encounter (Signed)
IMPRESSION:  Coronary calcium score of 23 . This was 59 th percentile for age and  sex matched control   Recommend starting atorvastatin 40mg  PO QD.  Check lipid panel in 2 months with ALT.   Candee Furbish, MD

## 2018-03-10 ENCOUNTER — Other Ambulatory Visit: Payer: 59 | Admitting: *Deleted

## 2018-03-10 ENCOUNTER — Encounter (INDEPENDENT_AMBULATORY_CARE_PROVIDER_SITE_OTHER): Payer: Self-pay

## 2018-03-10 DIAGNOSIS — Z79899 Other long term (current) drug therapy: Secondary | ICD-10-CM | POA: Diagnosis not present

## 2018-03-10 DIAGNOSIS — R931 Abnormal findings on diagnostic imaging of heart and coronary circulation: Secondary | ICD-10-CM | POA: Diagnosis not present

## 2018-03-11 LAB — LIPID PANEL
CHOL/HDL RATIO: 2.2 ratio (ref 0.0–4.4)
Cholesterol, Total: 98 mg/dL — ABNORMAL LOW (ref 100–199)
HDL: 44 mg/dL (ref >39)
LDL Calculated: 43 mg/dL (ref 0–99)
Triglycerides: 55 mg/dL (ref 0–149)
VLDL Cholesterol Cal: 11 mg/dL (ref 5–40)

## 2018-03-11 LAB — ALT: ALT: 15 IU/L (ref 0–32)

## 2018-11-22 ENCOUNTER — Other Ambulatory Visit: Payer: Self-pay | Admitting: Family Medicine

## 2018-11-22 DIAGNOSIS — Z1231 Encounter for screening mammogram for malignant neoplasm of breast: Secondary | ICD-10-CM

## 2019-01-07 ENCOUNTER — Other Ambulatory Visit: Payer: Self-pay

## 2019-01-07 ENCOUNTER — Ambulatory Visit
Admission: RE | Admit: 2019-01-07 | Discharge: 2019-01-07 | Disposition: A | Discharge status: Home / Self Care | Payer: 59 | Source: Ambulatory Visit | Attending: Family Medicine | Admitting: Family Medicine

## 2019-01-07 DIAGNOSIS — Z1231 Encounter for screening mammogram for malignant neoplasm of breast: Secondary | ICD-10-CM

## 2019-01-13 ENCOUNTER — Other Ambulatory Visit: Payer: Self-pay | Admitting: Cardiology

## 2019-01-18 ENCOUNTER — Other Ambulatory Visit: Payer: Self-pay

## 2019-01-18 DIAGNOSIS — Z20822 Contact with and (suspected) exposure to covid-19: Secondary | ICD-10-CM

## 2019-01-19 LAB — NOVEL CORONAVIRUS, NAA: SARS-CoV-2, NAA: NOT DETECTED

## 2019-02-19 ENCOUNTER — Other Ambulatory Visit: Payer: Self-pay | Admitting: Cardiology

## 2019-02-22 ENCOUNTER — Other Ambulatory Visit: Payer: Self-pay | Admitting: Cardiology

## 2019-02-22 MED ORDER — ATORVASTATIN CALCIUM 40 MG PO TABS: 40.0000 mg | ORAL_TABLET | Freq: Every day | ORAL | 0 refills | Status: DC

## 2019-03-29 ENCOUNTER — Other Ambulatory Visit: Payer: Self-pay | Admitting: Cardiology

## 2019-03-30 ENCOUNTER — Telehealth: Payer: Self-pay | Admitting: *Deleted

## 2019-03-30 NOTE — Telephone Encounter (Signed)
Call placed to pt re: refill request.  Pt overdue for an appt and was given #15 days supply of meds.  Called to offer an appt with Dr. Marlou Porch 12/9 or 12/10.  Left a message for pt to call back.

## 2019-04-01 ENCOUNTER — Other Ambulatory Visit: Payer: Self-pay | Admitting: Cardiology

## 2019-04-01 ENCOUNTER — Other Ambulatory Visit: Payer: Self-pay

## 2019-04-01 MED ORDER — ATORVASTATIN CALCIUM 40 MG PO TABS: 40.0000 mg | ORAL_TABLET | Freq: Every day | ORAL | 1 refills | Status: DC

## 2019-04-01 NOTE — Telephone Encounter (Signed)
New Message     *STAT* If patient is at the pharmacy, call can be transferred to refill team.   1. Which medications need to be refilled? (please list name of each medication and dose if known) atorvastatin (LIPITOR) 40 MG tablet  2. Which pharmacy/location (including street and city if local pharmacy) is medication to be sent to? [facility] [place], [place]  3. Do they need a 30 day or 90 day supply?  30 or 90

## 2019-04-25 ENCOUNTER — Ambulatory Visit: Payer: 59 | Attending: Internal Medicine

## 2019-04-25 DIAGNOSIS — Z20822 Contact with and (suspected) exposure to covid-19: Secondary | ICD-10-CM

## 2019-04-26 LAB — NOVEL CORONAVIRUS, NAA: SARS-CoV-2, NAA: NOT DETECTED

## 2019-05-06 ENCOUNTER — Ambulatory Visit: Payer: 59 | Admitting: Cardiology

## 2019-05-23 ENCOUNTER — Ambulatory Visit: Payer: 59 | Attending: Internal Medicine

## 2019-05-23 DIAGNOSIS — Z20822 Contact with and (suspected) exposure to covid-19: Secondary | ICD-10-CM

## 2019-05-24 LAB — NOVEL CORONAVIRUS, NAA: SARS-CoV-2, NAA: NOT DETECTED

## 2019-05-27 ENCOUNTER — Ambulatory Visit: Payer: 59 | Admitting: Cardiology

## 2019-05-27 ENCOUNTER — Other Ambulatory Visit: Payer: Self-pay

## 2019-05-27 ENCOUNTER — Encounter: Payer: Self-pay | Admitting: Cardiology

## 2019-05-27 VITALS — BP 106/70 | HR 72 | Ht 66.5 in | Wt 140.0 lb

## 2019-05-27 DIAGNOSIS — R931 Abnormal findings on diagnostic imaging of heart and coronary circulation: Secondary | ICD-10-CM | POA: Insufficient documentation

## 2019-05-27 DIAGNOSIS — I10 Essential (primary) hypertension: Secondary | ICD-10-CM | POA: Diagnosis not present

## 2019-05-27 NOTE — Patient Instructions (Signed)
Your physician recommends that you continue on your current medications as directed. Please refer to the Current Medication list given to you today.   Your physician wants you to follow-up in: [place] will receive a reminder letter in the mail two months in advance. If you don't receive a letter, please call our office to schedule the follow-up appointment.

## 2019-05-27 NOTE — Progress Notes (Signed)
Cardiology Office Note:    Date:  05/27/2019   ID:  Meghan Peterson, DOB 25-Jul-1955, MRN [id number]  PCP:  Donald Prose, MD  Cardiologist:  No primary care provider on file.   Referring MD: Donald Prose, MD    History of Present Illness:    Meghan Peterson is a 64 y.o. female here for follow-up coronary artery calcification, currently on statin therapy, essential hypertension.    Coronary calcium score was performed in 2019-Coronary calcium score of 23 . This was 8 th percentile for age andsex matched control.  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  She takes care of her 32 year old autistic son.  She does have a family history of coronary artery disease but no early CAD.  Her mother is alive, 15 years old, still smokes.  She quit smoking in 1989.  Occasionally she may have a mild twinge of chest discomfort but nothing severe, atypical.  No syncope.Marland Kitchen  Her risk factors at this point include hypertension, she is taking a half of a tablet of losartan a day.    She is doing very well on her atorvastatin 40 mg started after the coronary score was elevated.  She is not having any myalgias or difficulties with her medications.  Past Medical History:  Diagnosis Date  . Abnormal perimenopausal bleeding   . ASCUS with positive high risk HPV   . Atypical squamous cells of undetermined significance (ASCUS) on Papanicolaou smear of cervix   . Endometriosis   . Fibroids   . Herpes simplex type 2 infection   . Herpes simplex type II infection 10/27/2007   date of dx  . HPV (human papilloma virus) infection   . Hx: UTI (urinary tract infection)   . Menopausal symptoms   . Uterine leiomyoma   . Venereal disease     Past Surgical History:  Procedure Laterality Date  . BREAST CYST EXCISION  1978   left breast  . CESAREAN SECTION  12/17/96  . CHOLECYSTECTOMY  08/20/2011   Procedure: LAPAROSCOPIC CHOLECYSTECTOMY WITH INTRAOPERATIVE CHOLANGIOGRAM;  Surgeon:  Rolm Bookbinder, MD;  Location: [facility];  Service: General;  Laterality: N/A;  . LAPAROSCOPY  1997  . MYOMECTOMY  1997  . WISDOM TOOTH EXTRACTION      Current Medications: Current Meds  Medication Sig  . atorvastatin (LIPITOR) 40 MG tablet Take 1 tablet (40 mg total) by mouth daily at 6 PM. Pt needs to keep upcoming appt with provider in Feb, 2021 for further refills  . losartan-hydrochlorothiazide (HYZAAR) 100-12.5 MG tablet Take 0.5 tablets by mouth daily.  Marland Kitchen zolpidem (AMBIEN) 10 MG tablet Take 10 mg by mouth at bedtime as needed.     Allergies:   Patient has no known allergies.   Social History   Socioeconomic History  . Marital status: Single    Spouse name: Not on file  . Number of children: Not on file  . Years of education: Not on file  . Highest education level: Not on file  Occupational History  . Not on file  Tobacco Use  . Smoking status: Former Smoker    Quit date: 04/22/1987    Years since quitting: 32.1  . Smokeless tobacco: Never Used  Substance and Sexual Activity  . Alcohol use: Yes    Comment: OCCAS  . Drug use: No  . Sexual activity: Yes    Birth control/protection: Inserts    Comment: nuvaring  Other Topics Concern  . Not  on file  Social History Narrative  . Not on file   Social Determinants of Health   Financial Resource Strain:   . Difficulty of Paying Living Expenses: Not on file  Food Insecurity:   . Worried About Charity fundraiser in the Last Year: Not on file  . Ran Out of Food in the Last Year: Not on file  Transportation Needs:   . Lack of Transportation (Medical): Not on file  . Lack of Transportation (Non-Medical): Not on file  Physical Activity:   . Days of Exercise per Week: Not on file  . Minutes of Exercise per Session: Not on file  Stress:   . Feeling of Stress : Not on file  Social Connections:   . Frequency of Communication with Friends and Family: Not on file  . Frequency of Social Gatherings with Friends and Family:  Not on file  . Attends Religious Services: Not on file  . Active Member of Clubs or Organizations: Not on file  . Attends Archivist Meetings: Not on file  . Marital Status: Not on file     Family History: The patient's family history includes Breast cancer in her maternal aunt and maternal aunt; Cancer in her father and maternal aunt; Hypertension in her maternal aunt; Kidney disease in her father.  ROS:   Please see the history of present illness.    No myalgias no syncope no bleeding  All other systems reviewed and are negative.  EKGs/Labs/Other Studies Reviewed:    The following studies were reviewed today: Prior lab work reviewed, LDL 105, HDL 52, triglycerides 38, hemoglobin 13.7, creatinine 0.7, potassium 4.1, ALT 12, TSH 0.8.  EKG:  EKG is  ordered today.  The ekg ordered today demonstrates 05/27/2019-sinus rhythm 72 with no other abnormalities prior -sinus rhythm 75 with no other abnormalities. Personally viewed  Recent Labs: No results found for requested labs within last 8760 hours.  Recent Lipid Panel    Component Value Date/Time   CHOL 98 (L) 03/10/2018 1336   TRIG 55 03/10/2018 1336   HDL 44 03/10/2018 1336   CHOLHDL 2.2 03/10/2018 1336   LDLCALC 43 03/10/2018 1336    Physical Exam:    VS:  BP 106/70   Pulse 72   Ht 5' 6.5" (1.689 m)   Wt 140 lb (63.5 kg)   LMP 09/09/2013   BMI 22.26 kg/m     Wt Readings from Last 3 Encounters:  05/27/19 140 lb (63.5 kg)  11/30/17 145 lb 6.4 oz (66 kg)  02/11/12 135 lb (61.2 kg)    GEN: Well nourished, well developed, in no acute distress  HEENT: normal  Neck: no JVD, carotid bruits, or masses Cardiac: RRR; no murmurs, rubs, or gallops,no edema  Respiratory:  clear to auscultation bilaterally, normal work of breathing GI: soft, nontender, nondistended, + BS MS: no deformity or atrophy  Skin: warm and dry, no rash Neuro:  Alert and Oriented x 3, Strength and sensation are intact Psych: euthymic mood, full  affect   ASSESSMENT:    1. Elevated coronary artery calcium score   2. Essential hypertension    PLAN:    In order of problems listed above:  Coronary calcification -On atorvastatin 40 mg.  Excellent for prevention efforts given her coronary calcified plaque.  No myalgias.  Last LDL 56 from outside labs, Dr. Nancy Fetter, ALT 13, hemoglobin 13.3, creatinine 0.78.  Excellent.  No changes made.  Essential hypertension -Continue with losartan HCT combination.  Excellent  blood pressure control with no side effects.  Monitored by Dr. Nancy Fetter  1 year follow-up.   Medication Adjustments/Labs and Tests Ordered: Current medicines are reviewed at length with the patient today.  Concerns regarding medicines are outlined above.  Orders Placed This Encounter  Procedures  . EKG 12-Lead   No orders of the defined types were placed in this encounter.   Patient Instructions  Your physician recommends that you continue on your current medications as directed. Please refer to the Current Medication list given to you today.   Your physician wants you to follow-up in: [place] will receive a reminder letter in the mail two months in advance. If you don't receive a letter, please call our office to schedule the follow-up appointment.    Signed, Candee Furbish, MD  05/27/2019 12:09 PM    [facility]

## 2019-06-27 ENCOUNTER — Other Ambulatory Visit: Payer: Self-pay | Admitting: Cardiology

## 2019-07-18 ENCOUNTER — Ambulatory Visit: Payer: 59 | Attending: Internal Medicine

## 2019-07-18 DIAGNOSIS — Z20822 Contact with and (suspected) exposure to covid-19: Secondary | ICD-10-CM

## 2019-07-19 LAB — NOVEL CORONAVIRUS, NAA: SARS-CoV-2, NAA: NOT DETECTED

## 2019-07-19 LAB — SARS-COV-2, NAA 2 DAY TAT

## 2019-07-25 ENCOUNTER — Ambulatory Visit: Payer: 59 | Attending: Internal Medicine

## 2019-07-25 DIAGNOSIS — Z20822 Contact with and (suspected) exposure to covid-19: Secondary | ICD-10-CM

## 2019-07-26 LAB — NOVEL CORONAVIRUS, NAA: SARS-CoV-2, NAA: NOT DETECTED

## 2019-07-26 LAB — SARS-COV-2, NAA 2 DAY TAT

## 2019-12-15 ENCOUNTER — Other Ambulatory Visit: Payer: Self-pay | Admitting: Family Medicine

## 2019-12-15 DIAGNOSIS — Z0000 Encounter for general adult medical examination without abnormal findings: Secondary | ICD-10-CM

## 2020-01-16 ENCOUNTER — Other Ambulatory Visit: Payer: Self-pay

## 2020-01-16 ENCOUNTER — Ambulatory Visit
Admission: RE | Admit: 2020-01-16 | Discharge: 2020-01-16 | Disposition: A | Discharge status: Home / Self Care | Payer: 59 | Source: Ambulatory Visit | Attending: Family Medicine | Admitting: Family Medicine

## 2020-01-16 DIAGNOSIS — Z0000 Encounter for general adult medical examination without abnormal findings: Secondary | ICD-10-CM

## 2020-06-04 ENCOUNTER — Other Ambulatory Visit: Payer: Self-pay

## 2020-06-04 ENCOUNTER — Ambulatory Visit: Payer: 59 | Admitting: Cardiology

## 2020-06-04 ENCOUNTER — Encounter: Payer: Self-pay | Admitting: Cardiology

## 2020-06-04 VITALS — BP 120/70 | HR 74 | Ht 66.5 in | Wt 134.0 lb

## 2020-06-04 DIAGNOSIS — I10 Essential (primary) hypertension: Secondary | ICD-10-CM

## 2020-06-04 DIAGNOSIS — R931 Abnormal findings on diagnostic imaging of heart and coronary circulation: Secondary | ICD-10-CM | POA: Diagnosis not present

## 2020-06-04 NOTE — Progress Notes (Signed)
Cardiology Office Note:    Date:  06/04/2020   ID:  Meghan Peterson, DOB January 21, 1956, MRN [id number]  PCP:  Donald Prose, [place]  Cardiologist:  No primary care provider on file.  Advanced Practice Provider:  No care team member to display Electrophysiologist:  None       Referring MD: Donald Prose, MD     History of Present Illness:    Meghan Peterson is a 65 y.o. female here for the follow-up of coronary artery calcification, 2019 with calcium score of 23 which was 52 percentile for her age.  She takes care of a 24 year old autistic son.  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  Family history of CAD noted but no early CAD.  Her mother is alive at age 29, interestingly her mother still smokes.  She quit tobacco in 1989.  She is been feeling fairly well.  We will have her occasional "Twitter "of chest pain that is fleeting.  For the most part she feels fine.  Past Medical History:  Diagnosis Date  . Abnormal perimenopausal bleeding   . ASCUS with positive high risk HPV   . Atypical squamous cells of undetermined significance (ASCUS) on Papanicolaou smear of cervix   . Endometriosis   . Fibroids   . Herpes simplex type 2 infection   . Herpes simplex type II infection 10/27/2007   date of dx  . HPV (human papilloma virus) infection   . Hx: UTI (urinary tract infection)   . Menopausal symptoms   . Uterine leiomyoma   . Venereal disease     Past Surgical History:  Procedure Laterality Date  . BREAST CYST EXCISION  1978   left breast  . CESAREAN SECTION  12/17/96  . CHOLECYSTECTOMY  08/20/2011   Procedure: LAPAROSCOPIC CHOLECYSTECTOMY WITH INTRAOPERATIVE CHOLANGIOGRAM;  Surgeon: Rolm Bookbinder, MD;  Location: [facility];  Service: General;  Laterality: N/A;  . LAPAROSCOPY  1997  . MYOMECTOMY  1997  . WISDOM TOOTH EXTRACTION      Current Medications: Current Meds  Medication Sig  . atorvastatin (LIPITOR) 40  MG tablet TAKE 1 TABLET(40 MG) BY MOUTH DAILY AT 6 PM  . losartan-hydrochlorothiazide (HYZAAR) 100-12.5 MG tablet Take 0.5 tablets by mouth daily.  Marland Kitchen venlafaxine XR (EFFEXOR-XR) 75 MG 24 hr capsule Take 75 mg by mouth daily.  Marland Kitchen zolpidem (AMBIEN) 10 MG tablet Take 10 mg by mouth at bedtime as needed.     Allergies:   Patient has no known allergies.   Social History   Socioeconomic History  . Marital status: Single    Spouse name: Not on file  . Number of children: Not on file  . Years of education: Not on file  . Highest education level: Not on file  Occupational History  . Not on file  Tobacco Use  . Smoking status: Former Smoker    Quit date: 04/22/1987    Years since quitting: 33.1  . Smokeless tobacco: Never Used  Substance and Sexual Activity  . Alcohol use: Yes    Comment: OCCAS  . Drug use: No  . Sexual activity: Yes    Birth control/protection: Inserts    Comment: nuvaring  Other Topics Concern  . Not on file  Social History Narrative  . Not on file   Social Determinants of Health   Financial Resource Strain: Not on file  Food Insecurity: Not on file  Transportation Needs: Not on  file  Physical Activity: Not on file  Stress: Not on file  Social Connections: Not on file     Family History: The patient's family history includes Breast cancer in her maternal aunt and maternal aunt; Cancer in her father and maternal aunt; Hypertension in her maternal aunt; Kidney disease in her father.  ROS:   Please see the history of present illness.     All other systems reviewed and are negative.  EKGs/Labs/Other Studies Reviewed:    The following studies were reviewed today: Coronary calcium score 2019-23, 77th percentile  EKG:  EKG is  ordered today.  The ekg ordered today demonstrates sinus rhythm 74 no other abnormalities  Recent Labs: No results found for requested labs within last 8760 hours.  Recent Lipid Panel    Component Value Date/Time   CHOL 98 (L)  03/10/2018 1336   TRIG 55 03/10/2018 1336   HDL 44 03/10/2018 1336   CHOLHDL 2.2 03/10/2018 1336   LDLCALC 43 03/10/2018 1336     Risk Assessment/Calculations:      Physical Exam:    VS:  BP 120/70 (BP Location: Left Arm, Patient Position: Sitting, Cuff Size: Normal)   Pulse 74   Ht 5' 6.5" (1.689 m)   Wt 134 lb (60.8 kg)   LMP 09/09/2013   SpO2 97%   BMI 21.30 kg/m     Wt Readings from Last 3 Encounters:  06/04/20 134 lb (60.8 kg)  05/27/19 140 lb (63.5 kg)  11/30/17 145 lb 6.4 oz (66 kg)     GEN:  Well nourished, well developed in no acute distress HEENT: Normal NECK: No JVD; No carotid bruits LYMPHATICS: No lymphadenopathy CARDIAC: RRR, no murmurs, rubs, gallops RESPIRATORY:  Clear to auscultation without rales, wheezing or rhonchi  ABDOMEN: Soft, non-tender, non-distended MUSCULOSKELETAL:  No edema; No deformity  SKIN: Warm and dry NEUROLOGIC:  Alert and oriented x 3 PSYCHIATRIC:  Normal affect   ASSESSMENT:    1. Essential hypertension   2. Elevated coronary artery calcium score    PLAN:    In order of problems listed above:  Coronary artery calcification -On high intensity statin atorvastatin 40.  Doing well.  Excellent for prevention. -Last LDL 56. -Last ALT 20 In 2024 we may repeat her calcium score.  We would expect some increase in her overall calcium score given her statin use however if it was markedly increased, this may change our pharmaceutical options.  Essential hypertension -Continue with losartan HCT combination.  Good blood pressure.  Monitored by Dr. Nancy Fetter.   Hyperlipidemia -Continue with atorvastatin 40.  No myalgias.   Medication Adjustments/Labs and Tests Ordered: Current medicines are reviewed at length with the patient today.  Concerns regarding medicines are outlined above.  Orders Placed This Encounter  Procedures  . EKG 12-Lead   No orders of the defined types were placed in this encounter.   Patient Instructions   Medication Instructions:  The current medical regimen is effective;  continue present plan and medications.  *If you need a refill on your cardiac medications before your next appointment, please call your pharmacy*  Follow-Up: At [facility], you and your health needs are our priority.  As part of our continuing mission to provide you with exceptional heart care, we have created designated Provider Care Teams.  These Care Teams include your primary Cardiologist (physician) and Advanced Practice Providers (APPs -  Physician Assistants and Nurse Practitioners) who all work together to provide you with the care you need, when  you need it.  We recommend signing up for the patient portal called "MyChart".  Sign up information is provided on this After Visit Summary.  MyChart is used to connect with patients for Virtual Visits (Telemedicine).  Patients are able to view lab/test results, encounter notes, upcoming appointments, etc.  Non-urgent messages can be sent to your provider as well.   To learn more about what you can do with MyChart, go to [email or web address].    Your next appointment:   12 month(s)  The format for your next appointment:   In Person  Provider:   Candee Furbish, MD   Thank you for choosing [facility]!!        Signed, Candee Furbish, MD  06/04/2020 5:02 PM    [place]

## 2020-06-04 NOTE — Patient Instructions (Signed)
Medication Instructions:  The current medical regimen is effective;  continue present plan and medications.  *If you need a refill on your cardiac medications before your next appointment, please call your pharmacy*  Follow-Up: At [facility], you and your health needs are our priority.  As part of our continuing mission to provide you with exceptional heart care, we have created designated Provider Care Teams.  These Care Teams include your primary Cardiologist (physician) and Advanced Practice Providers (APPs -  Physician Assistants and Nurse Practitioners) who all work together to provide you with the care you need, when you need it.  We recommend signing up for the patient portal called "MyChart".  Sign up information is provided on this After Visit Summary.  MyChart is used to connect with patients for Virtual Visits (Telemedicine).  Patients are able to view lab/test results, encounter notes, upcoming appointments, etc.  Non-urgent messages can be sent to your provider as well.   To learn more about what you can do with MyChart, go to [email or web address].    Your next appointment:   12 month(s)  The format for your next appointment:   In Person  Provider:   Mark Skains, MD   Thank you for choosing [facility]!!      

## 2020-06-29 ENCOUNTER — Other Ambulatory Visit: Payer: Self-pay | Admitting: Cardiology

## 2021-01-10 ENCOUNTER — Other Ambulatory Visit: Payer: Self-pay | Admitting: Family Medicine

## 2021-01-10 DIAGNOSIS — Z1231 Encounter for screening mammogram for malignant neoplasm of breast: Secondary | ICD-10-CM

## 2021-02-15 ENCOUNTER — Ambulatory Visit: Payer: 59

## 2021-04-17 ENCOUNTER — Ambulatory Visit: Payer: 59

## 2021-07-12 ENCOUNTER — Other Ambulatory Visit: Payer: Self-pay | Admitting: Cardiology

## 2021-08-15 ENCOUNTER — Ambulatory Visit
Admission: RE | Admit: 2021-08-15 | Discharge: 2021-08-15 | Disposition: A | Discharge status: Home / Self Care | Payer: 59 | Source: Ambulatory Visit | Attending: Family Medicine | Admitting: Family Medicine

## 2021-08-15 DIAGNOSIS — Z1231 Encounter for screening mammogram for malignant neoplasm of breast: Secondary | ICD-10-CM

## 2021-08-16 ENCOUNTER — Other Ambulatory Visit: Payer: Self-pay | Admitting: Cardiology

## 2021-09-15 ENCOUNTER — Other Ambulatory Visit: Payer: Self-pay | Admitting: Cardiology

## 2021-09-17 ENCOUNTER — Other Ambulatory Visit: Payer: Self-pay

## 2021-09-17 MED ORDER — ATORVASTATIN CALCIUM 40 MG PO TABS: 40.0000 mg | ORAL_TABLET | Freq: Every day | ORAL | 0 refills | Status: DC

## 2021-10-13 ENCOUNTER — Other Ambulatory Visit: Payer: Self-pay | Admitting: Cardiology

## 2021-10-29 ENCOUNTER — Other Ambulatory Visit: Payer: Self-pay | Admitting: Cardiology

## 2021-10-30 MED ORDER — ATORVASTATIN CALCIUM 40 MG PO TABS: 40.0000 mg | ORAL_TABLET | Freq: Every day | ORAL | 0 refills | Status: DC

## 2021-10-30 NOTE — Addendum Note (Signed)
Addended by: Carter Kitten D on: 10/30/2021 08:13 AM   Modules accepted: Orders

## 2021-12-03 ENCOUNTER — Ambulatory Visit: Payer: 59 | Admitting: Cardiology

## 2021-12-06 ENCOUNTER — Encounter: Payer: Self-pay | Admitting: Cardiology

## 2021-12-06 ENCOUNTER — Ambulatory Visit: Payer: 59 | Admitting: Cardiology

## 2021-12-06 VITALS — BP 110/60 | HR 86 | Ht 66.5 in | Wt 145.0 lb

## 2021-12-06 DIAGNOSIS — R931 Abnormal findings on diagnostic imaging of heart and coronary circulation: Secondary | ICD-10-CM

## 2021-12-06 DIAGNOSIS — I10 Essential (primary) hypertension: Secondary | ICD-10-CM

## 2021-12-06 NOTE — Patient Instructions (Signed)
Medication Instructions:  The current medical regimen is effective;  continue present plan and medications.  *If you need a refill on your cardiac medications before your next appointment, please call your pharmacy*  Follow-Up: At [facility], you and your health needs are our priority.  As part of our continuing mission to provide you with exceptional heart care, we have created designated Provider Care Teams.  These Care Teams include your primary Cardiologist (physician) and Advanced Practice Providers (APPs -  Physician Assistants and Nurse Practitioners) who all work together to provide you with the care you need, when you need it.  We recommend signing up for the patient portal called "MyChart".  Sign up information is provided on this After Visit Summary.  MyChart is used to connect with patients for Virtual Visits (Telemedicine).  Patients are able to view lab/test results, encounter notes, upcoming appointments, etc.  Non-urgent messages can be sent to your provider as well.   To learn more about what you can do with MyChart, go to [email or web address].    Your next appointment:   1 year(s)  The format for your next appointment:   In Person  Provider:   Dr Candee Furbish   Important Information About Sugar

## 2021-12-06 NOTE — Progress Notes (Signed)
Cardiology Office Note:    Date:  12/06/2021   ID:  Meghan Peterson, DOB 01/29/1956, MRN [id number]  PCP:  Donald Prose, [place]  Cardiologist:  None  Advanced Practice Provider:  No care team member to display Electrophysiologist:  None       Referring MD: Donald Prose, MD     History of Present Illness:    Meghan Peterson is a 66 y.o. female here for the follow up of elevated calcium score and hypertension.   Previously here for the follow-up of coronary artery calcification, 2019 with calcium score of 23 which was 77 percentile for her age.  She takes care of a 78 year old autistic son.  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  Family history of CAD noted but no early CAD.  Her mother is alive at age 47, interestingly her mother still smokes.  She quit tobacco in 1989.  At her last appointment, she had been feeling fairly well.  She has had an occasional "Twitter "of chest pain that is fleeting.  Today:  She states she has been feeling fine. Discussed her calcium score briefly, as well as her lipid panels and medications.  She will be turning 67 shortly and will be deciding whether she wants to continue her work at the detention center or not. 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   Her most recent LDL is now 50.   She denies any palpitations, chest pain, shortness of breath, or peripheral edema. No lightheadedness, headaches, syncope, orthopnea, or PND.   (+)  Past Medical History:  Diagnosis Date   Abnormal perimenopausal bleeding    ASCUS with positive high risk HPV    Atypical squamous cells of undetermined significance (ASCUS) on Papanicolaou smear of cervix    Endometriosis    Fibroids    Herpes simplex type 2 infection    Herpes simplex type II infection 10/27/2007   date of dx   HPV (human papilloma virus) infection    Hx: UTI (urinary tract infection)     Menopausal symptoms    Uterine leiomyoma    Venereal disease     Past Surgical History:  Procedure Laterality Date   BREAST CYST EXCISION  1978   left breast   CESAREAN SECTION  12/17/96   CHOLECYSTECTOMY  08/20/2011   Procedure: LAPAROSCOPIC CHOLECYSTECTOMY WITH INTRAOPERATIVE CHOLANGIOGRAM;  Surgeon: Rolm Bookbinder, MD;  Location: [facility];  Service: General;  Laterality: N/A;   LAPAROSCOPY  1997   MYOMECTOMY  1997   WISDOM TOOTH EXTRACTION      Current Medications: Current Meds  Medication Sig   atorvastatin (LIPITOR) 40 MG tablet Take 1 tablet (40 mg total) by mouth daily. Please keep upcoming appt in August 2023 with Dr. Marlou Porch before anymore refills. Thank you Final Attempt   losartan-hydrochlorothiazide (HYZAAR) 100-12.5 MG tablet Take 0.5 tablets by mouth daily.   venlafaxine XR (EFFEXOR-XR) 75 MG 24 hr capsule Take 75 mg by mouth daily.     Allergies:   Patient has no known allergies.   Social History   Socioeconomic History   Marital status: Single    Spouse name: Not on file   Number of children: Not on file   Years of education: Not on file   Highest education level: Not on file  Occupational History   Not on file  Tobacco Use   Smoking status: Former  Types: Cigarettes    Quit date: 04/22/1987    Years since quitting: 34.6   Smokeless tobacco: Never  Substance and Sexual Activity   Alcohol use: Yes    Comment: OCCAS   Drug use: No   Sexual activity: Yes    Birth control/protection: Inserts    Comment: nuvaring  Other Topics Concern   Not on file  Social History Narrative   Not on file   Social Determinants of Health   Financial Resource Strain: Not on file  Food Insecurity: Not on file  Transportation Needs: Not on file  Physical Activity: Not on file  Stress: Not on file  Social Connections: Not on file     Family History: The patient's family history includes Breast cancer in her maternal aunt and maternal aunt; Cancer in her father and  maternal aunt; Hypertension in her maternal aunt; Kidney disease in her father.  ROS:   Please see the history of present illness.      All other systems reviewed and are negative.  EKGs/Labs/Other Studies Reviewed:    The following studies were reviewed today:  Coronary CT 01/04/2018: FINDINGS: Non-cardiac: See separate report from [facility].   Ascending Aorta: Normal diameter 3.2 cm   Pericardium: Normal   Coronary arteries: Calcium noted in proximal and mid LAD   IMPRESSION: Coronary calcium score of 23 . This was 55 th percentile for age and sex matched control.   Coronary calcium score 2019-23, 77th percentile  EKG: EKG is personally reviewed.  12/06/21: Sinus rhythm. Rate 86 bpm.   06/04/20: Sinus rhythm 74 no other abnormalities  Recent Labs: No results found for requested labs within last 365 days.  Recent Lipid Panel    Component Value Date/Time   CHOL 98 (L) 03/10/2018 1336   TRIG 55 03/10/2018 1336   HDL 44 03/10/2018 1336   CHOLHDL 2.2 03/10/2018 1336   LDLCALC 43 03/10/2018 1336     Risk Assessment/Calculations:      Physical Exam:    VS:  BP 110/60 (BP Location: Left Arm, Patient Position: Sitting, Cuff Size: Normal)   Pulse 86   Ht 5' 6.5" (1.689 m)   Wt 145 lb (65.8 kg)   LMP 09/09/2013   BMI 23.05 kg/m     Wt Readings from Last 3 Encounters:  12/06/21 145 lb (65.8 kg)  06/04/20 134 lb (60.8 kg)  05/27/19 140 lb (63.5 kg)     GEN: Well nourished, well developed in no acute distress HEENT: Normal NECK: No JVD; No carotid bruits LYMPHATICS: No lymphadenopathy CARDIAC: RRR, no murmurs, rubs, gallops RESPIRATORY:  Clear to auscultation without rales, wheezing or rhonchi  ABDOMEN: Soft, non-tender, non-distended MUSCULOSKELETAL:  No edema; No deformity  SKIN: Warm and dry NEUROLOGIC:  Alert and oriented x 3 PSYCHIATRIC:  Normal affect   ASSESSMENT:    1. Essential hypertension   2. Elevated coronary artery calcium score      PLAN:    In order of problems listed above:  Coronary artery calcification -On high intensity statin atorvastatin 40.  Doing well.  Excellent for prevention. -Last LDL 50.  Outside labs reviewed. -Last ALT 17 -Discussed rationale at this visit on not repeating calcium score unless symptoms arise and then we will consider coronary CT or other functional testing.  Essential hypertension -Continue with losartan HCT combination.  Good blood pressure.  Monitored by Dr. Nancy Fetter.  Stable.  No changes made.   Hyperlipidemia -Continue with atorvastatin 40.  No myalgias.  Doing  well with LDL of 50.  Follow up: 1 year  Medication Adjustments/Labs and Tests Ordered: Current medicines are reviewed at length with the patient today.  Concerns regarding medicines are outlined above.  Orders Placed This Encounter  Procedures   EKG 12-Lead   No orders of the defined types were placed in this encounter.   Patient Instructions  Medication Instructions:  The current medical regimen is effective;  continue present plan and medications.  *If you need a refill on your cardiac medications before your next appointment, please call your pharmacy*  Follow-Up: At [facility], you and your health needs are our priority.  As part of our continuing mission to provide you with exceptional heart care, we have created designated Provider Care Teams.  These Care Teams include your primary Cardiologist (physician) and Advanced Practice Providers (APPs -  Physician Assistants and Nurse Practitioners) who all work together to provide you with the care you need, when you need it.  We recommend signing up for the patient portal called "MyChart".  Sign up information is provided on this After Visit Summary.  MyChart is used to connect with patients for Virtual Visits (Telemedicine).  Patients are able to view lab/test results, encounter notes, upcoming appointments, etc.  Non-urgent messages can be sent to your  provider as well.   To learn more about what you can do with MyChart, go to [email or web address].    Your next appointment:   1 year(s)  The format for your next appointment:   In Person  Provider:   Dr Meghan Peterson   Important Information About Sugar          I,Meghan Peterson,acting as a scribe for Meghan Furbish, MD.,have documented all relevant documentation on the behalf of Meghan Furbish, MD,as directed by  Meghan Furbish, MD while in the presence of Meghan Furbish, MD.   I, Meghan Furbish, MD, have reviewed all documentation for this visit. The documentation on 12/06/21 for the exam, diagnosis, procedures, and orders are all accurate and complete.   Signed, Meghan Furbish, MD  12/06/2021 12:12 PM    [place]

## 2022-02-14 ENCOUNTER — Other Ambulatory Visit: Payer: Self-pay | Admitting: Cardiology

## 2022-02-14 ENCOUNTER — Other Ambulatory Visit: Payer: Self-pay

## 2022-08-28 ENCOUNTER — Other Ambulatory Visit: Payer: Self-pay | Admitting: Family Medicine

## 2022-08-28 DIAGNOSIS — Z1231 Encounter for screening mammogram for malignant neoplasm of breast: Secondary | ICD-10-CM

## 2022-09-10 ENCOUNTER — Ambulatory Visit
Admission: RE | Admit: 2022-09-10 | Discharge: 2022-09-10 | Disposition: A | Discharge status: Home / Self Care | Payer: 59 | Source: Ambulatory Visit | Attending: Family Medicine | Admitting: Family Medicine

## 2022-09-10 DIAGNOSIS — Z1231 Encounter for screening mammogram for malignant neoplasm of breast: Secondary | ICD-10-CM

## 2022-12-10 ENCOUNTER — Other Ambulatory Visit: Payer: Self-pay | Admitting: Cardiology

## 2022-12-11 ENCOUNTER — Encounter: Payer: Self-pay | Admitting: Physician Assistant

## 2022-12-11 NOTE — Progress Notes (Signed)
   Cardiology Office Note    Date:  12/15/2022  ID:  Meghan Peterson, Meghan Peterson 04-Sep-1955, MRN [id number] PCP:  Deatra James, MD  Cardiologist:  Donato Schultz, MD  Electrophysiologist:  None   Chief Complaint: f/u coronary calcification  History of Present Illness: .    Meghan Peterson is a 67 y.o. female with visit-pertinent history of coronary calcification, HTN, HLD, uterine leiomyoma seen for follow-up. Calcium score 2019 showed CAD 23 (77%ile) in prox and mLAD, no acute overread findings. She is seen for follow-up overall doing great. Several months ago she had episode of acid reflux provoked by food eaten that day but no anginal type chest pain. She does not exercise but reports she is active at work at [employer]. [facility] and has not had any chest pain, DOE, syncope or any other concerning cardiac symptoms. Her father had an MI in his 51s.  Labwork independently reviewed: KPN 06/2022 Cr 0.69, Tchol 105, HDL 49, Hgb 12.7, LDL 46, K 4.1, trig 41, TSH wnl, LFTs wnl 2019 LDL 43, trig 55, ALT wnl 2013 K 3.7, Cr 0.80, CBC wnl  ROS: .    Please see the history of present illness.  All other systems are reviewed and otherwise negative.  Studies Reviewed: Marland Kitchen    EKG:  EKG is ordered today, personally reviewed, demonstrating NSR 81bpm no acute STT changes  CV Studies: Cardiac studies reviewed are outlined and summarized above. Otherwise please see EMR for full report.   Current Reported Medications:.    Current Meds  Medication Sig   atorvastatin (LIPITOR) 40 MG tablet Take 1 tablet (40 mg total) by mouth daily. 1ST ATTEMPT, PT IS OVERDUE FOR AN APPT. PLEASE HAVE PT CALL TO SCHEDULE CAN OK 30 DAYS   clobetasol ointment (TEMOVATE) 0.05 % SMARTSIG:sparingly Topical Twice Daily   losartan-hydrochlorothiazide (HYZAAR) 100-12.5 MG tablet Take 0.5 tablets by mouth daily.   venlafaxine XR (EFFEXOR-XR) 75 MG 24 hr capsule Take 75 mg by mouth daily.    Physical Exam:    VS:  BP  110/70 (BP Location: Left Arm, Patient Position: Sitting, Cuff Size: Normal)   Pulse 81   Ht 5' 6.5" (1.689 m)   Wt 144 lb (65.3 kg)   LMP 09/09/2013   BMI 22.89 kg/m    Wt Readings from Last 3 Encounters:  12/15/22 144 lb (65.3 kg)  12/06/21 145 lb (65.8 kg)  06/04/20 134 lb (60.8 kg)    GEN: Well nourished, well developed in no acute distress NECK: No JVD; No carotid bruits CARDIAC: RRR, no murmurs, rubs, gallops RESPIRATORY:  Clear to auscultation without rales, wheezing or rhonchi  ABDOMEN: Soft, non-tender, non-distended EXTREMITIES:  No edema; No acute deformity   Asessement and Plan:.    1. Coronary calcification - no concerning anginal symptoms. Retrospectively recalls one episode a few months ago of acid-reflux type symptoms but no true anginal symptoms and no recurrence with exertional activities. We discussed to notify if symptoms recur. Her EKG is normal and she otherwise reports she is doing great. Continue with risk factor modification with BP control and statin therapy. Given CAC <100 will hold off prophylactic ASA but continue to revisit at each OV.  2. HTN - blood pressure well controlled. Labs reviewed from 06/2022. Continue with losartan-hydrochlorothiazide at present dose.  3. Hyperlipidemia - refill atorvastatin today, labs from 06/2022 reviewed. Tolerating dose well.    Disposition: F/u with Dr. Anne Fu in 1 year.  Signed, Laurann Montana, PA-C

## 2022-12-15 ENCOUNTER — Encounter: Payer: Self-pay | Admitting: Physician Assistant

## 2022-12-15 ENCOUNTER — Ambulatory Visit: Payer: 59 | Attending: Physician Assistant | Admitting: Physician Assistant

## 2022-12-15 VITALS — BP 110/70 | HR 81 | Ht 66.5 in | Wt 144.0 lb

## 2022-12-15 DIAGNOSIS — I2584 Coronary atherosclerosis due to calcified coronary lesion: Secondary | ICD-10-CM | POA: Diagnosis not present

## 2022-12-15 DIAGNOSIS — I10 Essential (primary) hypertension: Secondary | ICD-10-CM

## 2022-12-15 DIAGNOSIS — I2510 Atherosclerotic heart disease of native coronary artery without angina pectoris: Secondary | ICD-10-CM | POA: Diagnosis not present

## 2022-12-15 DIAGNOSIS — E785 Hyperlipidemia, unspecified: Secondary | ICD-10-CM

## 2022-12-15 MED ORDER — ATORVASTATIN CALCIUM 40 MG PO TABS: 40.0000 mg | ORAL_TABLET | Freq: Every day | ORAL | 3 refills | Status: DC

## 2022-12-15 NOTE — Patient Instructions (Signed)
Medication Instructions:  Your physician recommends that you continue on your current medications as directed. Please refer to the Current Medication list given to you today.  *If you need a refill on your cardiac medications before your next appointment, please call your pharmacy*  Lab Work: None ordered today  Testing/Procedures: None ordered today  Follow-Up: At [facility], you and your health needs are our priority.  As part of our continuing mission to provide you with exceptional heart care, we have created designated Provider Care Teams.  These Care Teams include your primary Cardiologist (physician) and Advanced Practice Providers (APPs -  Physician Assistants and Nurse Practitioners) who all work together to provide you with the care you need, when you need it.  Your next appointment:   12 month(s)  The format for your next appointment:   In Person  Provider:   Donato Schultz, MD

## 2023-03-08 IMAGING — MG MM DIGITAL SCREENING BILAT W/ TOMO AND CAD
8 series · 9 of 24 positions shown · non-contrast
Comparison: Previous exam(s).

CLINICAL DATA: Screening.

EXAM:
DIGITAL SCREENING BILATERAL MAMMOGRAM WITH TOMOSYNTHESIS AND CAD
TECHNIQUE: Bilateral screening digital craniocaudal and mediolateral oblique
mammograms were obtained. Bilateral screening digital breast
tomosynthesis was performed. The images were evaluated with
computer-aided detection.

[R CC synth-2D]
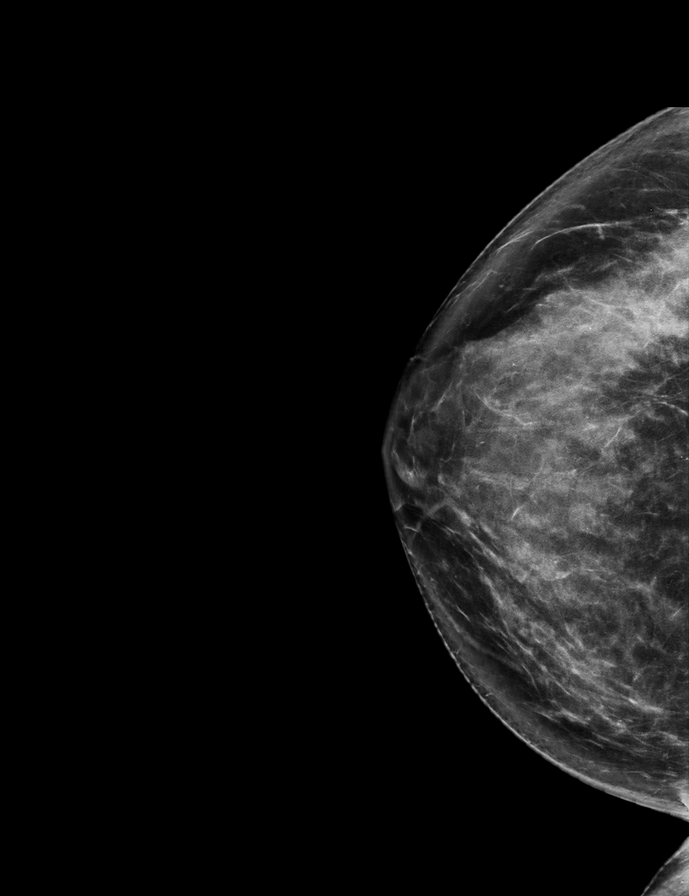

[L MLO synth-2D]
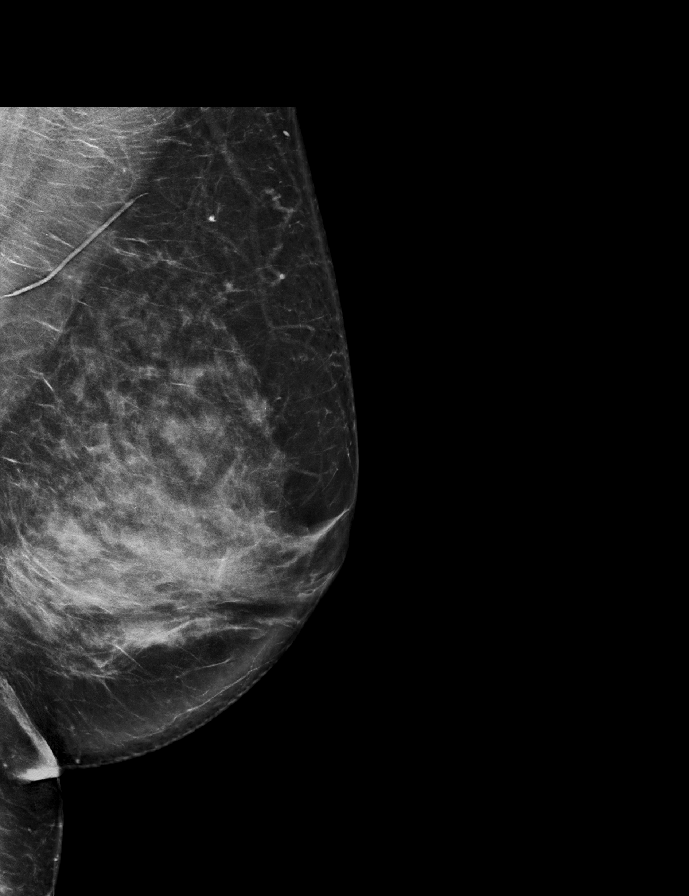

[L CC synth-2D]
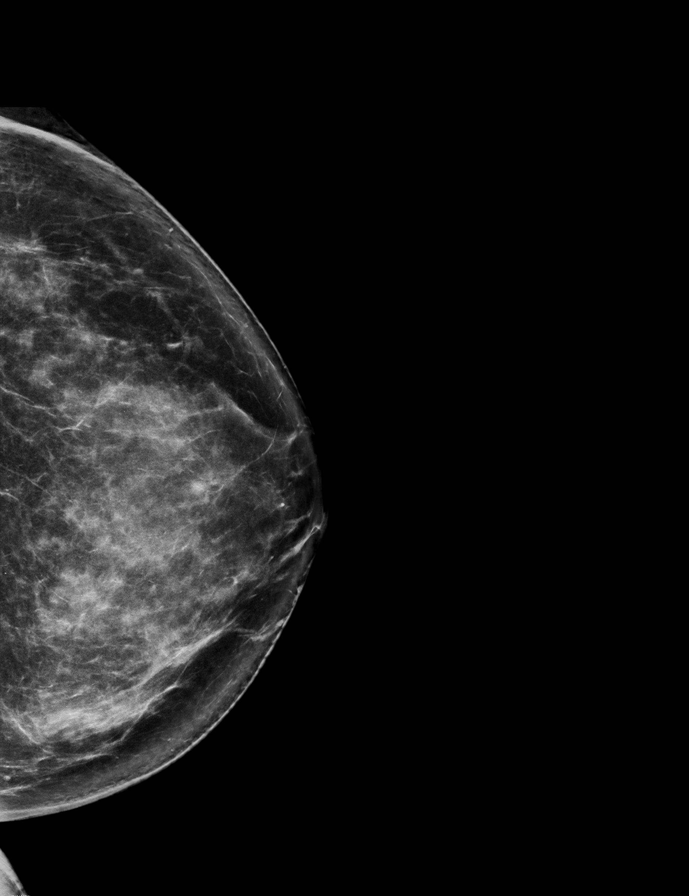

[R MLO synth-2D]
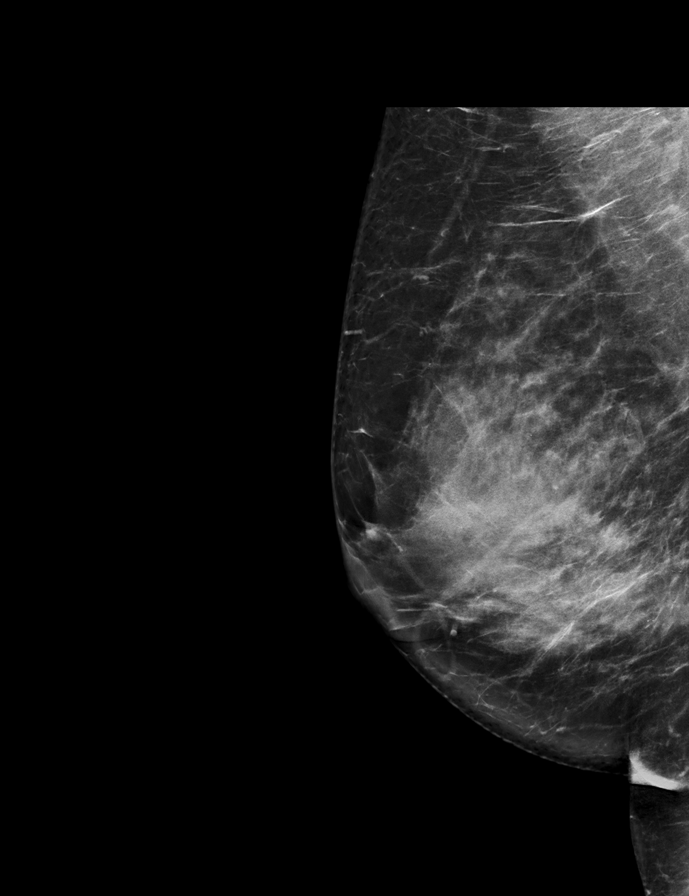

[R CC tomo · 2 of 79 frames shown]
[frame 26/79]
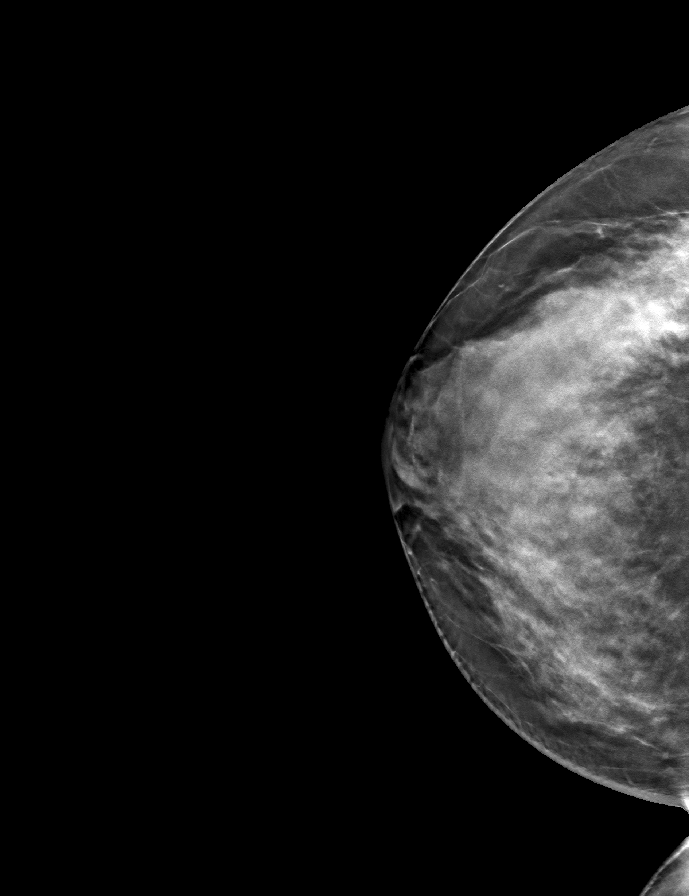
[frame 40/79]
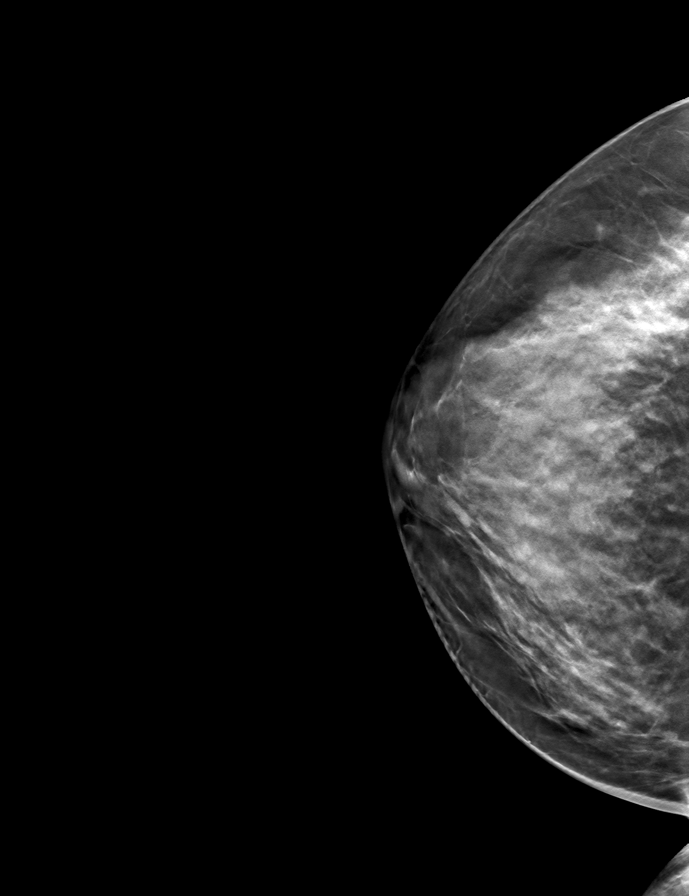

[L CC tomo · tomo slice 39/78.0]
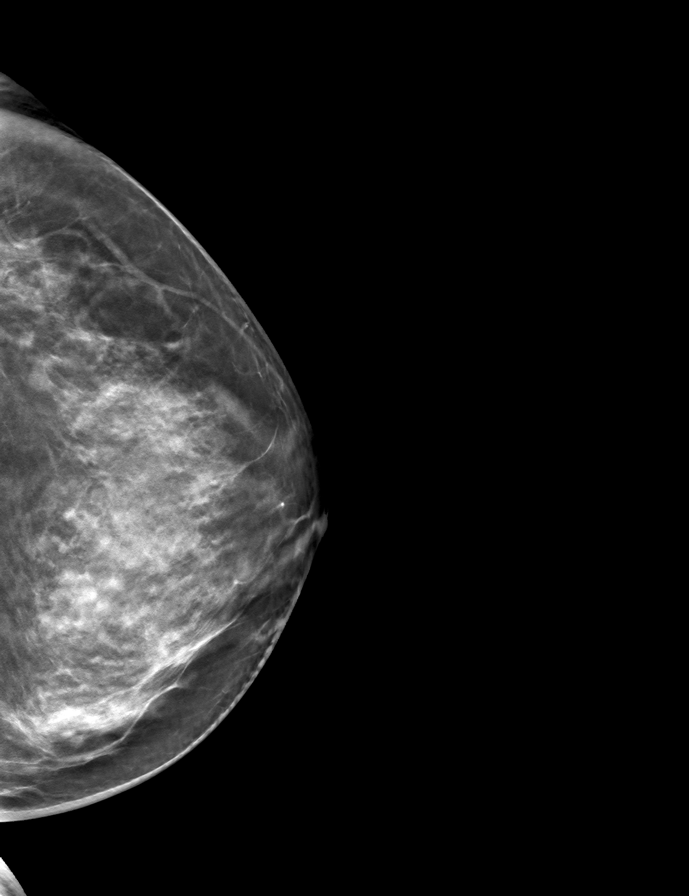

[L MLO tomo · tomo slice 43/86.0]
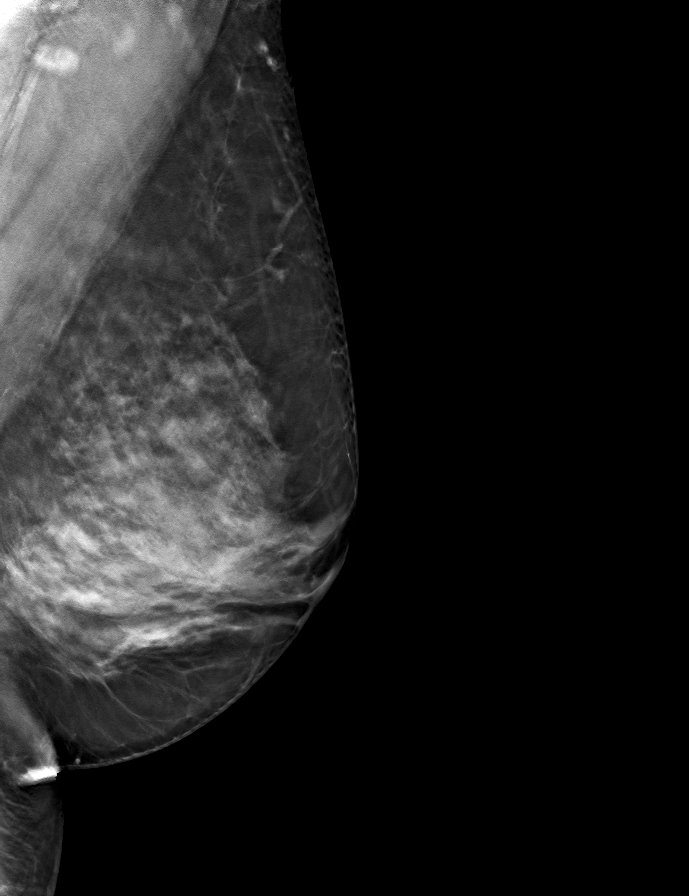

[R MLO tomo · tomo slice 44/87.0]
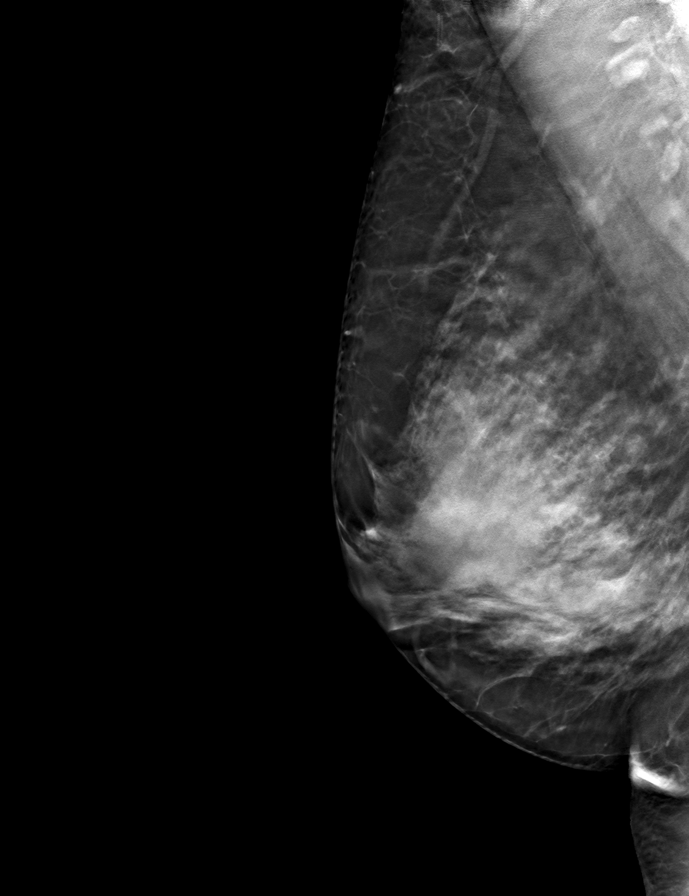

[9 of 24 positions shown; findings below may reference images not displayed]

ACR Breast Density Category d: The breast tissue is extremely dense,
which lowers the sensitivity of mammography
FINDINGS: There are no findings suspicious for malignancy.
IMPRESSION: No mammographic evidence of malignancy. A result letter of this
screening mammogram will be mailed directly to the patient.

RECOMMENDATION:
Screening mammogram in one year. (Code:TA-V-WV9)

BI-RADS CATEGORY  1: Negative.

## 2023-09-16 ENCOUNTER — Other Ambulatory Visit: Payer: Self-pay | Admitting: Family Medicine

## 2023-09-16 DIAGNOSIS — Z1231 Encounter for screening mammogram for malignant neoplasm of breast: Secondary | ICD-10-CM

## 2023-09-29 ENCOUNTER — Ambulatory Visit
Admission: RE | Admit: 2023-09-29 | Discharge: 2023-09-29 | Disposition: A | Discharge status: Home / Self Care | Source: Ambulatory Visit | Attending: Family Medicine | Admitting: Family Medicine

## 2023-09-29 DIAGNOSIS — Z1231 Encounter for screening mammogram for malignant neoplasm of breast: Secondary | ICD-10-CM

## 2024-01-12 ENCOUNTER — Ambulatory Visit: Admitting: Cardiology

## 2024-01-14 ENCOUNTER — Other Ambulatory Visit: Payer: Self-pay | Admitting: Physician Assistant

## 2024-03-03 ENCOUNTER — Ambulatory Visit (HOSPITAL_BASED_OUTPATIENT_CLINIC_OR_DEPARTMENT_OTHER): Admitting: Cardiology

## 2024-03-03 ENCOUNTER — Encounter (HOSPITAL_BASED_OUTPATIENT_CLINIC_OR_DEPARTMENT_OTHER): Payer: Self-pay | Admitting: Cardiology

## 2024-03-03 VITALS — BP 122/76 | HR 98 | Ht 66.5 in | Wt 147.7 lb

## 2024-03-03 DIAGNOSIS — I10 Essential (primary) hypertension: Secondary | ICD-10-CM | POA: Diagnosis not present

## 2024-03-03 DIAGNOSIS — E785 Hyperlipidemia, unspecified: Secondary | ICD-10-CM | POA: Diagnosis not present

## 2024-03-03 DIAGNOSIS — I2510 Atherosclerotic heart disease of native coronary artery without angina pectoris: Secondary | ICD-10-CM | POA: Diagnosis not present

## 2024-03-03 NOTE — Patient Instructions (Signed)
 Medication Instructions:  No changes *If you need a refill on your cardiac medications before your next appointment, please call your pharmacy*  Lab Work: none  Testing/Procedures: none  Follow-Up: At [facility], you and your health needs are our priority.  As part of our continuing mission to provide you with exceptional heart care, our providers are all part of one team.  This team includes your primary Cardiologist (physician) and Advanced Practice Providers or APPs (Physician Assistants and Nurse Practitioners) who all work together to provide you with the care you need, when you need it.  Your next appointment:   12 month(s)  Provider:   Oneil Parchment, MD, Rosaline Bane, NP, or Reche Finder, NP

## 2024-03-03 NOTE — Progress Notes (Signed)
  Cardiology Office Note:  .   Date:  03/03/2024  ID:  Meghan Peterson, DOB Jun 22, 1955, MRN [id number] PCP: Sun, Vyvyan, MD  [facility] Providers Cardiologist:  Oneil Parchment, MD    History of Present Illness: .   Meghan Peterson is a 68 y.o. female Discussed the use of AI scribe  History of Present Illness Meghan Peterson is a 68 year old female with coronary calcification who presents for follow-up.  Her calcium  score in 2019 was 23, placing her in the 77th percentile, with calcification noted in the proximal and mid LAD. She has no chest pain. She is on atorvastatin , which has successfully reduced her LDL to 46.  Her hypertension is managed with medication, which she finds helpful. No new symptoms related to hypertension are reported.  She has a history of hyperlipidemia and is taking atorvastatin  to manage her cholesterol levels, with an LDL previously recorded at 46.  She works at [employer] and has been employed there for nearly 15 years. She has a special needs son who is 25 years old.      Studies Reviewed: SABRA   EKG Interpretation Date/Time:  Thursday March 03 2024 11:36:41 EST Ventricular Rate:  90 PR Interval:  122 QRS Duration:  70 QT Interval:  372 QTC Calculation: 455 R Axis:   67  Text Interpretation: Normal sinus rhythm Normal ECG When compared with ECG of 15-Dec-2022 08:18, No significant change was found Confirmed by Parchment Oneil ([id number]) on 03/03/2024 11:47:45 AM    Results LABS LDL: 46 Creatinine: 0.7  RADIOLOGY Calcium  score: 23, 77th percentile (2019)  DIAGNOSTIC EKG: No acute changes Risk Assessment/Calculations:            Physical Exam:   VS:  BP 122/76   Pulse 98   Ht 5' 6.5 (1.689 m)   Wt 147 lb 11.2 oz (67 kg)   LMP 09/09/2013   SpO2 91%   BMI 23.48 kg/m    Wt Readings from Last 3 Encounters:  03/03/24 147 lb 11.2 oz (67 kg)  12/15/22 144 lb (65.3 kg)  12/06/21 145 lb (65.8 kg)     GEN: Well nourished, well developed in no acute distress NECK: No JVD; No carotid bruits CARDIAC: RRR, no murmurs, no rubs, no gallops RESPIRATORY:  Clear to auscultation without rales, wheezing or rhonchi  ABDOMEN: Soft, non-tender, non-distended EXTREMITIES:  No edema; No deformity   ASSESSMENT AND PLAN: .    Assessment and Plan Assessment & Plan Atherosclerotic heart disease of native coronary artery without angina pectoris Coronary calcification with a calcium  score of 23 in 2019, indicating a 77th percentile risk. No current symptoms of angina. Family history of myocardial infarction in father during her 63s. EKG shows no acute changes. Current management with atorvastatin  has effectively lowered LDL to 46, below the target of less than 70 due to plaque presence. Discussed potential use of low-dose aspirin to reduce cardiovascular risk, with bleeding as a low-risk side effect. - Continue atorvastatin  therapy. - Consider low-dose aspirin for cardiovascular risk reduction.  Essential hypertension - Continue current antihypertensive medication.  Hyperlipidemia LDL cholesterol is well-controlled at 46, below the target of less than 70 due to coronary plaque. Current management with atorvastatin  is effective. - Continue atorvastatin  therapy.         Dispo: 1 yr  Signed, Oneil Parchment, MD
